# Patient Record
Sex: Female | Born: 1980 | Race: White | Hispanic: No | Marital: Single | Smoking: Former smoker
Health system: Southern US, Community
[De-identification: ages and names within clinical notes are randomized; demographics above are authoritative.]

## PROBLEM LIST (undated history)

## (undated) DIAGNOSIS — E119 Type 2 diabetes mellitus without complications: Secondary | ICD-10-CM

## (undated) DIAGNOSIS — E781 Pure hyperglyceridemia: Secondary | ICD-10-CM

## (undated) DIAGNOSIS — K859 Acute pancreatitis without necrosis or infection, unspecified: Secondary | ICD-10-CM

## (undated) HISTORY — PX: HERNIA REPAIR: SHX51

---
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
## 2004-09-21 ENCOUNTER — Ambulatory Visit: Payer: Self-pay | Admitting: Family Medicine

## 2004-09-27 ENCOUNTER — Ambulatory Visit: Payer: Self-pay | Admitting: Family Medicine

## 2004-09-27 ENCOUNTER — Other Ambulatory Visit: Admission: RE | Admit: 2004-09-27 | Discharge: 2004-09-27 | Payer: Self-pay | Admitting: Family Medicine

## 2004-10-31 ENCOUNTER — Ambulatory Visit: Payer: Self-pay | Admitting: Family Medicine

## 2004-12-14 ENCOUNTER — Ambulatory Visit: Payer: Self-pay | Admitting: Family Medicine

## 2004-12-24 ENCOUNTER — Ambulatory Visit: Payer: Self-pay | Admitting: Family Medicine

## 2005-01-03 ENCOUNTER — Ambulatory Visit: Payer: Self-pay | Admitting: Family Medicine

## 2005-01-23 ENCOUNTER — Ambulatory Visit: Payer: Self-pay | Admitting: Family Medicine

## 2005-05-02 ENCOUNTER — Ambulatory Visit: Payer: Self-pay | Admitting: Family Medicine

## 2005-05-10 ENCOUNTER — Ambulatory Visit: Payer: Self-pay | Admitting: Family Medicine

## 2005-09-24 ENCOUNTER — Ambulatory Visit: Payer: Self-pay | Admitting: Family Medicine

## 2016-07-23 DIAGNOSIS — R739 Hyperglycemia, unspecified: Secondary | ICD-10-CM | POA: Diagnosis not present

## 2016-07-23 DIAGNOSIS — R197 Diarrhea, unspecified: Secondary | ICD-10-CM | POA: Diagnosis not present

## 2016-07-24 DIAGNOSIS — R739 Hyperglycemia, unspecified: Secondary | ICD-10-CM | POA: Diagnosis not present

## 2016-07-24 DIAGNOSIS — R197 Diarrhea, unspecified: Secondary | ICD-10-CM | POA: Diagnosis not present

## 2016-10-10 DIAGNOSIS — K2980 Duodenitis without bleeding: Secondary | ICD-10-CM

## 2016-10-10 DIAGNOSIS — Z8639 Personal history of other endocrine, nutritional and metabolic disease: Secondary | ICD-10-CM | POA: Diagnosis not present

## 2016-10-10 DIAGNOSIS — E1110 Type 2 diabetes mellitus with ketoacidosis without coma: Secondary | ICD-10-CM | POA: Diagnosis not present

## 2016-10-10 DIAGNOSIS — R000 Tachycardia, unspecified: Secondary | ICD-10-CM

## 2016-10-10 DIAGNOSIS — K760 Fatty (change of) liver, not elsewhere classified: Secondary | ICD-10-CM

## 2016-10-10 DIAGNOSIS — K859 Acute pancreatitis without necrosis or infection, unspecified: Secondary | ICD-10-CM

## 2016-10-11 DIAGNOSIS — E1110 Type 2 diabetes mellitus with ketoacidosis without coma: Secondary | ICD-10-CM | POA: Diagnosis not present

## 2016-10-11 DIAGNOSIS — Z8639 Personal history of other endocrine, nutritional and metabolic disease: Secondary | ICD-10-CM | POA: Diagnosis not present

## 2016-10-11 DIAGNOSIS — K2980 Duodenitis without bleeding: Secondary | ICD-10-CM | POA: Diagnosis not present

## 2016-10-11 DIAGNOSIS — K859 Acute pancreatitis without necrosis or infection, unspecified: Secondary | ICD-10-CM | POA: Diagnosis not present

## 2016-10-12 ENCOUNTER — Encounter (HOSPITAL_COMMUNITY): Payer: Self-pay | Admitting: *Deleted

## 2016-10-12 ENCOUNTER — Inpatient Hospital Stay (HOSPITAL_COMMUNITY)
Admission: AD | Admit: 2016-10-12 | Discharge: 2016-10-24 | DRG: 438 | Disposition: A | Discharge status: Home / Self Care | Payer: BLUE CROSS/BLUE SHIELD | Source: Other Acute Inpatient Hospital | Attending: Internal Medicine | Admitting: Internal Medicine

## 2016-10-12 ENCOUNTER — Inpatient Hospital Stay (HOSPITAL_COMMUNITY): Payer: BLUE CROSS/BLUE SHIELD

## 2016-10-12 DIAGNOSIS — E8770 Fluid overload, unspecified: Secondary | ICD-10-CM | POA: Diagnosis not present

## 2016-10-12 DIAGNOSIS — E876 Hypokalemia: Secondary | ICD-10-CM | POA: Diagnosis not present

## 2016-10-12 DIAGNOSIS — E1165 Type 2 diabetes mellitus with hyperglycemia: Secondary | ICD-10-CM | POA: Diagnosis present

## 2016-10-12 DIAGNOSIS — Z6839 Body mass index (BMI) 39.0-39.9, adult: Secondary | ICD-10-CM | POA: Diagnosis not present

## 2016-10-12 DIAGNOSIS — K859 Acute pancreatitis without necrosis or infection, unspecified: Secondary | ICD-10-CM | POA: Diagnosis present

## 2016-10-12 DIAGNOSIS — R000 Tachycardia, unspecified: Secondary | ICD-10-CM | POA: Diagnosis present

## 2016-10-12 DIAGNOSIS — K760 Fatty (change of) liver, not elsewhere classified: Secondary | ICD-10-CM | POA: Diagnosis present

## 2016-10-12 DIAGNOSIS — G894 Chronic pain syndrome: Secondary | ICD-10-CM | POA: Diagnosis not present

## 2016-10-12 DIAGNOSIS — R6510 Systemic inflammatory response syndrome (SIRS) of non-infectious origin without acute organ dysfunction: Secondary | ICD-10-CM | POA: Diagnosis not present

## 2016-10-12 DIAGNOSIS — J9601 Acute respiratory failure with hypoxia: Secondary | ICD-10-CM | POA: Diagnosis not present

## 2016-10-12 DIAGNOSIS — A419 Sepsis, unspecified organism: Secondary | ICD-10-CM | POA: Diagnosis not present

## 2016-10-12 DIAGNOSIS — E781 Pure hyperglyceridemia: Secondary | ICD-10-CM | POA: Diagnosis present

## 2016-10-12 DIAGNOSIS — K567 Ileus, unspecified: Secondary | ICD-10-CM | POA: Diagnosis present

## 2016-10-12 DIAGNOSIS — E1110 Type 2 diabetes mellitus with ketoacidosis without coma: Secondary | ICD-10-CM | POA: Diagnosis present

## 2016-10-12 DIAGNOSIS — Z8639 Personal history of other endocrine, nutritional and metabolic disease: Secondary | ICD-10-CM | POA: Diagnosis not present

## 2016-10-12 DIAGNOSIS — B3749 Other urogenital candidiasis: Secondary | ICD-10-CM | POA: Diagnosis not present

## 2016-10-12 DIAGNOSIS — E119 Type 2 diabetes mellitus without complications: Secondary | ICD-10-CM | POA: Diagnosis not present

## 2016-10-12 DIAGNOSIS — J690 Pneumonitis due to inhalation of food and vomit: Secondary | ICD-10-CM | POA: Diagnosis not present

## 2016-10-12 DIAGNOSIS — Z794 Long term (current) use of insulin: Secondary | ICD-10-CM | POA: Diagnosis not present

## 2016-10-12 DIAGNOSIS — R0902 Hypoxemia: Secondary | ICD-10-CM

## 2016-10-12 DIAGNOSIS — R109 Unspecified abdominal pain: Secondary | ICD-10-CM | POA: Diagnosis present

## 2016-10-12 DIAGNOSIS — R7309 Other abnormal glucose: Secondary | ICD-10-CM | POA: Diagnosis not present

## 2016-10-12 DIAGNOSIS — T402X5A Adverse effect of other opioids, initial encounter: Secondary | ICD-10-CM | POA: Diagnosis not present

## 2016-10-12 DIAGNOSIS — T8089XA Other complications following infusion, transfusion and therapeutic injection, initial encounter: Secondary | ICD-10-CM | POA: Diagnosis not present

## 2016-10-12 DIAGNOSIS — K2980 Duodenitis without bleeding: Secondary | ICD-10-CM | POA: Diagnosis not present

## 2016-10-12 DIAGNOSIS — K858 Other acute pancreatitis without necrosis or infection: Secondary | ICD-10-CM | POA: Diagnosis not present

## 2016-10-12 DIAGNOSIS — M7989 Other specified soft tissue disorders: Secondary | ICD-10-CM | POA: Diagnosis not present

## 2016-10-12 HISTORY — DX: Pure hyperglyceridemia: E78.1

## 2016-10-12 HISTORY — DX: Acute pancreatitis without necrosis or infection, unspecified: K85.90

## 2016-10-12 HISTORY — DX: Type 2 diabetes mellitus without complications: E11.9

## 2016-10-12 LAB — CBC WITH DIFFERENTIAL/PLATELET
BASOS ABS: 0 10*3/uL (ref 0.0–0.1)
BASOS PCT: 0 %
Eosinophils Absolute: 0 10*3/uL (ref 0.0–0.7)
Eosinophils Relative: 0 %
HEMATOCRIT: 39.9 % (ref 36.0–46.0)
Hemoglobin: 13.1 g/dL (ref 12.0–15.0)
LYMPHS PCT: 16 %
Lymphs Abs: 2.1 10*3/uL (ref 0.7–4.0)
MCH: 29 pg (ref 26.0–34.0)
MCHC: 32.8 g/dL (ref 30.0–36.0)
MCV: 88.5 fL (ref 78.0–100.0)
MONO ABS: 0.4 10*3/uL (ref 0.1–1.0)
Monocytes Relative: 4 %
NEUTROS ABS: 10.2 10*3/uL — AB (ref 1.7–7.7)
Neutrophils Relative %: 80 %
Platelets: 239 10*3/uL (ref 150–400)
RBC: 4.51 MIL/uL (ref 3.87–5.11)
RDW: 14.3 % (ref 11.5–15.5)
WBC: 12.7 10*3/uL — AB (ref 4.0–10.5)

## 2016-10-12 LAB — URINALYSIS, ROUTINE W REFLEX MICROSCOPIC
Bilirubin Urine: NEGATIVE
Glucose, UA: >=500 mg/dL — AB
Ketones, ur: 5 mg/dL — AB
LEUKOCYTES UA: NEGATIVE
NITRITE: NEGATIVE
PROTEIN: 100 mg/dL — AB
SPECIFIC GRAVITY, URINE: 1.041 — AB (ref 1.005–1.030)
pH: 5 (ref 5.0–8.0)

## 2016-10-12 LAB — GLUCOSE, CAPILLARY
GLUCOSE-CAPILLARY: 160 mg/dL — AB (ref 65–99)
GLUCOSE-CAPILLARY: 197 mg/dL — AB (ref 65–99)
GLUCOSE-CAPILLARY: 206 mg/dL — AB (ref 65–99)
GLUCOSE-CAPILLARY: 226 mg/dL — AB (ref 65–99)
Glucose-Capillary: 169 mg/dL — ABNORMAL HIGH (ref 65–99)
Glucose-Capillary: 167 mg/dL — ABNORMAL HIGH (ref 65–99)
Glucose-Capillary: 187 mg/dL — ABNORMAL HIGH (ref 65–99)

## 2016-10-12 LAB — COMPREHENSIVE METABOLIC PANEL
ALBUMIN: 1.6 g/dL — AB (ref 3.5–5.0)
ALK PHOS: 31 U/L — AB (ref 38–126)
ALT: 17 U/L (ref 14–54)
AST: 26 U/L (ref 15–41)
Anion gap: 8 (ref 5–15)
BILIRUBIN TOTAL: 0.9 mg/dL (ref 0.3–1.2)
BUN: 16 mg/dL (ref 6–20)
CALCIUM: 5.6 mg/dL — AB (ref 8.9–10.3)
CO2: 14 mmol/L — ABNORMAL LOW (ref 22–32)
Chloride: 115 mmol/L — ABNORMAL HIGH (ref 101–111)
Creatinine, Ser: 1.12 mg/dL — ABNORMAL HIGH (ref 0.44–1.00)
GFR calc Af Amer: >60 mL/min (ref >60)
GLUCOSE: 191 mg/dL — AB (ref 65–99)
POTASSIUM: 3.6 mmol/L (ref 3.5–5.1)
Sodium: 137 mmol/L (ref 135–145)
TOTAL PROTEIN: 4.7 g/dL — AB (ref 6.5–8.1)

## 2016-10-12 LAB — RAPID URINE DRUG SCREEN, HOSP PERFORMED
Amphetamines: NOT DETECTED
BENZODIAZEPINES: NOT DETECTED
Barbiturates: NOT DETECTED
Cocaine: NOT DETECTED
OPIATES: POSITIVE — AB
Tetrahydrocannabinol: NOT DETECTED

## 2016-10-12 LAB — BASIC METABOLIC PANEL
Anion gap: 9 (ref 5–15)
BUN: 14 mg/dL (ref 6–20)
CALCIUM: 6.7 mg/dL — AB (ref 8.9–10.3)
CHLORIDE: 112 mmol/L — AB (ref 101–111)
CO2: 15 mmol/L — ABNORMAL LOW (ref 22–32)
CREATININE: 1.02 mg/dL — AB (ref 0.44–1.00)
GFR calc non Af Amer: >60 mL/min (ref >60)
Glucose, Bld: 238 mg/dL — ABNORMAL HIGH (ref 65–99)
Potassium: 4 mmol/L (ref 3.5–5.1)
SODIUM: 136 mmol/L (ref 135–145)

## 2016-10-12 LAB — LACTIC ACID, PLASMA
Lactic Acid, Venous: 1 mmol/L (ref 0.5–1.9)
Lactic Acid, Venous: 1 mmol/L (ref 0.5–1.9)

## 2016-10-12 LAB — APTT: APTT: 31 s (ref 24–36)

## 2016-10-12 LAB — PROTIME-INR
INR: 1.13
Prothrombin Time: 14.5 seconds (ref 11.4–15.2)

## 2016-10-12 LAB — PROCALCITONIN: Procalcitonin: 3.38 ng/mL

## 2016-10-12 LAB — MRSA PCR SCREENING: MRSA by PCR: NEGATIVE

## 2016-10-12 LAB — PHOSPHORUS: PHOSPHORUS: 1.8 mg/dL — AB (ref 2.5–4.6)

## 2016-10-12 LAB — D-DIMER, QUANTITATIVE (NOT AT ARMC): D DIMER QUANT: 5.44 ug{FEU}/mL — AB (ref 0.00–0.50)

## 2016-10-12 LAB — POCT I-STAT 3, ART BLOOD GAS (G3+)
Acid-base deficit: 10 mmol/L — ABNORMAL HIGH (ref 0.0–2.0)
Bicarbonate: 16 mmol/L — ABNORMAL LOW (ref 20.0–28.0)
O2 Saturation: 29 %
PH ART: 7.233 — AB (ref 7.350–7.450)
TCO2: 17 mmol/L (ref 0–100)
pCO2 arterial: 38.6 mmHg (ref 32.0–48.0)
pO2, Arterial: 23 mmHg — CL (ref 83.0–108.0)

## 2016-10-12 LAB — MAGNESIUM: MAGNESIUM: 2.3 mg/dL (ref 1.7–2.4)

## 2016-10-12 LAB — AMYLASE: AMYLASE: 478 U/L — AB (ref 28–100)

## 2016-10-12 LAB — CORTISOL: CORTISOL PLASMA: 37.7 ug/dL

## 2016-10-12 LAB — TROPONIN I
Troponin I: 0.04 ng/mL (ref <0.03)
Troponin I: 0.05 ng/mL (ref <0.03)

## 2016-10-12 LAB — LIPASE, BLOOD: LIPASE: 174 U/L — AB (ref 11–51)

## 2016-10-12 LAB — BETA-HYDROXYBUTYRIC ACID: Beta-Hydroxybutyric Acid: 1 mmol/L — ABNORMAL HIGH (ref 0.05–0.27)

## 2016-10-12 LAB — BRAIN NATRIURETIC PEPTIDE: B NATRIURETIC PEPTIDE 5: 190 pg/mL — AB (ref 0.0–100.0)

## 2016-10-12 MED ORDER — ORAL CARE MOUTH RINSE: 15.0000 mL | Freq: Two times a day (BID) | OROMUCOSAL | Status: DC

## 2016-10-12 MED ORDER — HYDROMORPHONE HCL 1 MG/ML IJ SOLN
0.5000 mg | INTRAMUSCULAR | Status: DC | PRN
  Administered 2016-10-12 – 2016-10-13 (×6): 0.5 mg via INTRAVENOUS
  Filled 2016-10-12 (×6): qty 1

## 2016-10-12 MED ORDER — DEXTROSE-NACL 5-0.45 % IV SOLN
INTRAVENOUS | Status: DC
  Administered 2016-10-12 – 2016-10-19 (×16): via INTRAVENOUS

## 2016-10-12 MED ORDER — INSULIN REGULAR HUMAN 100 UNIT/ML IJ SOLN: INTRAMUSCULAR | Status: DC

## 2016-10-12 MED ORDER — SODIUM CHLORIDE 0.9 % IV SOLN: INTRAVENOUS | Status: DC

## 2016-10-12 MED ORDER — SODIUM CHLORIDE 0.9 % IV SOLN
Freq: Once | INTRAVENOUS | Status: AC
  Administered 2016-10-12: 20:00:00 via INTRAVENOUS
  Filled 2016-10-12: qty 1000

## 2016-10-12 MED ORDER — HEPARIN SODIUM (PORCINE) 5000 UNIT/ML IJ SOLN: 5000.0000 [IU] | Freq: Three times a day (TID) | INTRAMUSCULAR | Status: DC

## 2016-10-12 MED ORDER — CHLORHEXIDINE GLUCONATE 0.12 % MT SOLN
15.0000 mL | Freq: Two times a day (BID) | OROMUCOSAL | Status: DC
  Administered 2016-10-12 – 2016-10-20 (×9): 15 mL via OROMUCOSAL
  Filled 2016-10-12 (×12): qty 15

## 2016-10-12 MED ORDER — ORAL CARE MOUTH RINSE
15.0000 mL | Freq: Two times a day (BID) | OROMUCOSAL | Status: DC
Start: 2016-10-13 — End: 2016-10-24
  Administered 2016-10-13: 15 mL via OROMUCOSAL

## 2016-10-12 MED ORDER — SODIUM CHLORIDE 0.9 % IV SOLN
INTRAVENOUS | Status: DC
  Administered 2016-10-12: 1 [IU]/h via INTRAVENOUS
  Administered 2016-10-14: 4.5 [IU]/h via INTRAVENOUS
  Administered 2016-10-14: 5 [IU]/h via INTRAVENOUS
  Administered 2016-10-14: 6.7 [IU]/h via INTRAVENOUS
  Administered 2016-10-15: 6.6 [IU]/h via INTRAVENOUS
  Administered 2016-10-15: 4.9 [IU]/h via INTRAVENOUS
  Administered 2016-10-15: 6.4 [IU]/h via INTRAVENOUS
  Administered 2016-10-15: 4.3 [IU]/h via INTRAVENOUS
  Administered 2016-10-15: 3.3 [IU]/h via INTRAVENOUS
  Administered 2016-10-15: 5.1 [IU]/h via INTRAVENOUS
  Administered 2016-10-16: 3.6 [IU]/h via INTRAVENOUS
  Administered 2016-10-18: 4.9 [IU]/h via INTRAVENOUS
  Filled 2016-10-12 (×5): qty 2.5

## 2016-10-12 MED ORDER — HEPARIN SODIUM (PORCINE) 5000 UNIT/ML IJ SOLN
5000.0000 [IU] | Freq: Three times a day (TID) | INTRAMUSCULAR | Status: DC
  Filled 2016-10-12: qty 1

## 2016-10-12 MED ORDER — SODIUM CHLORIDE 0.9 % IV SOLN
INTRAVENOUS | Status: AC
  Administered 2016-10-12: 18:00:00 via INTRAVENOUS

## 2016-10-12 MED ORDER — LACTATED RINGERS IV BOLUS (SEPSIS)
2000.0000 mL | Freq: Once | INTRAVENOUS | Status: AC
  Administered 2016-10-12: 2000 mL via INTRAVENOUS

## 2016-10-12 MED ORDER — HEPARIN SODIUM (PORCINE) 5000 UNIT/ML IJ SOLN
5000.0000 [IU] | Freq: Three times a day (TID) | INTRAMUSCULAR | Status: DC
  Administered 2016-10-12 – 2016-10-24 (×35): 5000 [IU] via SUBCUTANEOUS
  Filled 2016-10-12 (×32): qty 1

## 2016-10-12 MED ORDER — SODIUM CHLORIDE 0.9 % IV BOLUS (SEPSIS)
1000.0000 mL | Freq: Once | INTRAVENOUS | Status: AC
  Administered 2016-10-12: 1000 mL via INTRAVENOUS

## 2016-10-12 MED ORDER — SODIUM CHLORIDE 0.9 % IV SOLN
2.0000 g | Freq: Once | INTRAVENOUS | Status: AC
  Administered 2016-10-12: 2 g via INTRAVENOUS
  Filled 2016-10-12: qty 20

## 2016-10-12 MED ORDER — LACTATED RINGERS IV SOLN
INTRAVENOUS | Status: DC
Start: 2016-10-12 — End: 2016-10-13
  Administered 2016-10-13: 02:00:00 via INTRAVENOUS

## 2016-10-12 MED ORDER — DEXTROSE-NACL 5-0.45 % IV SOLN: INTRAVENOUS | Status: DC

## 2016-10-12 NOTE — Progress Notes (Signed)
RRT collected Gas, looks like it ended up being a VBG. RN aware

## 2016-10-12 NOTE — Progress Notes (Signed)
eLink Physician-Brief Progress Note Patient Name: Nathanial Millmanlicia D Uzelac DOB: [birth year] MRN: [id number]   Date of Service  10/12/2016  HPI/Events of Note  Calcium = 5.6 and Albumin = 1.6. Calcium corrected for albumin = 7.52.  eICU Interventions  Will replace Calcium.     Intervention Category Major Interventions: Electrolyte abnormality - evaluation and management  Sommer,Steven Eugene 10/12/2016, 8:27 PM

## 2016-10-12 NOTE — H&P (Signed)
PULMONARY / CRITICAL CARE MEDICINE   Name: Melissa Beasley MRN: [id number] DOB: Mar 30, 1981    ADMISSION DATE:  10/12/2016  HISTORY OF PRESENT ILLNESS:   Presented to [facility] on Friday w/ 1 day hx of abd pain. She has a PMHx of DM2, and apparently familial hypertriglyceridemia, although it is not known if this was known prior to her presentation. She was found to have pancreatitis with triglycerides of >5500, with a lipase of 450. RUQ showed a fatty liver, but no biliary obstruction. CT and subsequent study showed evolving pancreatic edema, but without necrosis or fluid collection. [facility] was [facility].  PAST MEDICAL HISTORY :  She  has no past medical history on file.  PAST SURGICAL HISTORY: She  has no past surgical history on file.  No Known Allergies  No current facility-administered medications on file prior to encounter.    No current outpatient prescriptions on file prior to encounter.    FAMILY HISTORY:  Her has no family status information on file.    SOCIAL HISTORY: She  reports that she has never smoked. She has never used smokeless tobacco.  REVIEW OF SYSTEMS:   She reports abdominal pain, no EtOH use  SUBJECTIVE:  36 y/o woman with pancreatitis from severe hypertriglyceridemia.   VITAL SIGNS: BP (!) 146/96   Pulse (!) 139   Temp (!) 100.9 F (38.3 C) (Core (Comment))   Resp (!) 29   Ht 5\' 6"  (1.676 m)   Wt 229 lb 0.9 oz (103.9 kg)   SpO2 96%   BMI 36.97 kg/m   HEMODYNAMICS:    VENTILATOR SETTINGS:    INTAKE / OUTPUT: I/O last 3 completed shifts: In: 3001.1 [I.V.:3001.1] Out: 200 [Emesis/NG output:200]  PHYSICAL EXAMINATION: General:  Obese woman in NAD Neuro:  Awake, somnolent, but able to answer questions HEENT:  Dry MM Cardiovascular:  Tachycardic Lungs:  CTA Abdomen:  Distended, firm, and tender Musculoskeletal:  Normal Skin:  No rashes  LABS:  BMET  Recent Labs Lab 10/12/16 1833  NA 137  K 3.6   CL 115*  CO2 14*  BUN 16  CREATININE 1.12*  GLUCOSE 191*    Electrolytes  Recent Labs Lab 10/12/16 1833  CALCIUM 5.6*  MG 2.3  PHOS 1.8*    CBC  Recent Labs Lab 10/12/16 1833  WBC 12.7*  HGB 13.1  HCT 39.9  PLT 239    Coag's  Recent Labs Lab 10/12/16 1833  APTT 31  INR 1.13    Sepsis Markers  Recent Labs Lab 10/12/16 1833  LATICACIDVEN 1.0  PROCALCITON 3.38    ABG  Recent Labs Lab 10/12/16 2042  PHART 7.233*  PCO2ART 38.6  PO2ART 23.0*    Liver Enzymes  Recent Labs Lab 10/12/16 1833  AST 26  ALT 17  ALKPHOS 31*  BILITOT 0.9  ALBUMIN 1.6*    Cardiac Enzymes  Recent Labs Lab 10/12/16 1833  TROPONINI 0.04*    Glucose  Recent Labs Lab 10/12/16 1754 10/12/16 1851 10/12/16 1957  GLUCAP 160* 169* 167*    Imaging Dg Chest Port 1 View  Result Date: 10/12/2016 CLINICAL DATA:  Acute respiratory distress EXAM: PORTABLE CHEST 1 VIEW COMPARISON:  10/12/2016 FINDINGS: Right jugular central line and nasogastric catheter are noted in satisfactory position. Cardiac shadow is stable. The overall inspiratory effort is poor with persistent left basilar atelectasis. No new focal abnormality is seen. IMPRESSION: No change from the prior exam. Electronically Signed   By: Alcide CleverMark  Lukens  M.D.   On: 10/12/2016 18:32    STUDIES:  None  CULTURES: None  ANTIBIOTICS: none  SIGNIFICANT EVENTS: None  LINES/TUBES: PIV, Foley  DISCUSSION: 36 y/o woman with pancreatitis due to very severe hyperglycemia  ASSESSMENT / PLAN:  PULMONARY A: No active issues P:    CARDIOVASCULAR A:  Tachycardia P:  Due to pancreatitis/SIRS  RENAL A:   NAGMA Mild renal insuffiencey Mild hypocalcemia P:   Change fluids to LR as hepatic function is intact and LA is normal. Replete Ca  GASTROINTESTINAL A:   Pancreatitis from hypertriglyceridemia Ileus P:   Maintain NGT for now Pain control w/ dilaudid 0.5 q2hr PRN NPO for  now  HEMATOLOGIC A:   SIRS from pancreatitis P:  At risk for clots, so good DVT ppx  INFECTIOUS A:   Fevers commonly seen with pancreatitis P:   Hold ABX for now, but low threshold to start imperic tx.  ENDOCRINE A:   Hypertriglyceridemia   Hyperglycemia P:   Ca only mildly decreased, glucose elevated, but without anion gap (i.e. Not DKA) Although Trig very elevated, given normal LA and good function, will hold off on consideration of plasma exchange for trig reduction.  NEUROLOGIC A:   Intact neuro status P:   RASS goal: 0   FAMILY  - Updates: Updated on admission  - Inter-disciplinary family meet or Palliative Care meeting due by:  10/19/16  CRITICAL CARE Performed by: Jamie Kato   Total critical care time: 60 minutes  Critical care time was exclusive of separately billable procedures and treating other patients.  Critical care was necessary to treat or prevent imminent or life-threatening deterioration.  Critical care was time spent personally by me on the following activities: development of treatment plan with patient and/or surrogate as well as nursing, discussions with consultants, evaluation of patient's response to treatment, examination of patient, obtaining history from patient or surrogate, ordering and performing treatments and interventions, ordering and review of laboratory studies, ordering and review of radiographic studies, pulse oximetry and re-evaluation of patient's condition.  Jamie Kato, MD Pulmonary and Critical Care Medicine [facility] Pager: [phone]  10/12/2016, 9:51 PM

## 2016-10-12 NOTE — Progress Notes (Signed)
eLink Physician-Brief Progress Note Patient Name: Melissa Millmanlicia D Nantz DOB: [birth year] MRN: [id number]   Date of Service  10/12/2016  HPI/Events of Note  Admitted with DKA and Pancreatitis. Currently on on Insulin IV infusion.   eICU Interventions  Will order:  1. Insulin IV infusion per DKA protocol.      Intervention Category Major Interventions: Other:  Lenell AntuSommer,Alaijah Gibler Eugene 10/12/2016, 5:19 PM

## 2016-10-13 ENCOUNTER — Inpatient Hospital Stay (HOSPITAL_COMMUNITY): Payer: BLUE CROSS/BLUE SHIELD

## 2016-10-13 DIAGNOSIS — K567 Ileus, unspecified: Secondary | ICD-10-CM

## 2016-10-13 DIAGNOSIS — E1165 Type 2 diabetes mellitus with hyperglycemia: Secondary | ICD-10-CM

## 2016-10-13 DIAGNOSIS — K858 Other acute pancreatitis without necrosis or infection: Secondary | ICD-10-CM

## 2016-10-13 LAB — GLUCOSE, CAPILLARY
GLUCOSE-CAPILLARY: 201 mg/dL — AB (ref 65–99)
GLUCOSE-CAPILLARY: 175 mg/dL — AB (ref 65–99)
GLUCOSE-CAPILLARY: 181 mg/dL — AB (ref 65–99)
GLUCOSE-CAPILLARY: 160 mg/dL — AB (ref 65–99)
Glucose-Capillary: 212 mg/dL — ABNORMAL HIGH (ref 65–99)
Glucose-Capillary: 179 mg/dL — ABNORMAL HIGH (ref 65–99)
Glucose-Capillary: 176 mg/dL — ABNORMAL HIGH (ref 65–99)

## 2016-10-13 LAB — TRIGLYCERIDES
TRIGLYCERIDES: 1694 mg/dL — AB (ref <150)
Triglycerides: 2036 mg/dL — ABNORMAL HIGH (ref <150)

## 2016-10-13 LAB — BASIC METABOLIC PANEL
ANION GAP: 4 — AB (ref 5–15)
ANION GAP: 5 (ref 5–15)
Anion gap: 6 (ref 5–15)
BUN: 12 mg/dL (ref 6–20)
BUN: 11 mg/dL (ref 6–20)
BUN: 9 mg/dL (ref 6–20)
CHLORIDE: 109 mmol/L (ref 101–111)
CHLORIDE: 110 mmol/L (ref 101–111)
CO2: 19 mmol/L — AB (ref 22–32)
CO2: 21 mmol/L — ABNORMAL LOW (ref 22–32)
CO2: 22 mmol/L (ref 22–32)
Calcium: 6.7 mg/dL — ABNORMAL LOW (ref 8.9–10.3)
Calcium: 6.8 mg/dL — ABNORMAL LOW (ref 8.9–10.3)
Calcium: 6.9 mg/dL — ABNORMAL LOW (ref 8.9–10.3)
Chloride: 112 mmol/L — ABNORMAL HIGH (ref 101–111)
Creatinine, Ser: 0.71 mg/dL (ref 0.44–1.00)
Creatinine, Ser: 0.61 mg/dL (ref 0.44–1.00)
Creatinine, Ser: 0.65 mg/dL (ref 0.44–1.00)
GFR calc non Af Amer: >60 mL/min (ref >60)
Glucose, Bld: 203 mg/dL — ABNORMAL HIGH (ref 65–99)
Glucose, Bld: 213 mg/dL — ABNORMAL HIGH (ref 65–99)
Glucose, Bld: 158 mg/dL — ABNORMAL HIGH (ref 65–99)
POTASSIUM: 4.1 mmol/L (ref 3.5–5.1)
POTASSIUM: 4.2 mmol/L (ref 3.5–5.1)
POTASSIUM: 3.6 mmol/L (ref 3.5–5.1)
SODIUM: 135 mmol/L (ref 135–145)
SODIUM: 138 mmol/L (ref 135–145)
Sodium: 135 mmol/L (ref 135–145)

## 2016-10-13 LAB — PHOSPHORUS: PHOSPHORUS: 1.2 mg/dL — AB (ref 2.5–4.6)

## 2016-10-13 LAB — CBC
HCT: 37.4 % (ref 36.0–46.0)
Hemoglobin: 12 g/dL (ref 12.0–15.0)
MCH: 28.7 pg (ref 26.0–34.0)
MCHC: 32.1 g/dL (ref 30.0–36.0)
MCV: 89.5 fL (ref 78.0–100.0)
PLATELETS: 259 10*3/uL (ref 150–400)
RBC: 4.18 MIL/uL (ref 3.87–5.11)
RDW: 14.5 % (ref 11.5–15.5)
WBC: 13.3 10*3/uL — AB (ref 4.0–10.5)

## 2016-10-13 LAB — MAGNESIUM: Magnesium: 2.3 mg/dL (ref 1.7–2.4)

## 2016-10-13 LAB — TROPONIN I: Troponin I: 0.04 ng/mL (ref <0.03)

## 2016-10-13 MED ORDER — SODIUM CHLORIDE 0.9% FLUSH
10.0000 mL | INTRAVENOUS | Status: DC | PRN
  Administered 2016-10-18 (×2): 10 mL
  Filled 2016-10-13 (×2): qty 40

## 2016-10-13 MED ORDER — ACETAMINOPHEN 325 MG PO TABS
650.0000 mg | ORAL_TABLET | Freq: Four times a day (QID) | ORAL | Status: DC | PRN
  Administered 2016-10-14 – 2016-10-19 (×2): 650 mg via ORAL
  Filled 2016-10-13 (×2): qty 2

## 2016-10-13 MED ORDER — PIPERACILLIN-TAZOBACTAM 3.375 G IVPB
3.3750 g | Freq: Three times a day (TID) | INTRAVENOUS | Status: DC
  Administered 2016-10-13 – 2016-10-24 (×32): 3.375 g via INTRAVENOUS
  Filled 2016-10-13 (×34): qty 50

## 2016-10-13 MED ORDER — POTASSIUM PHOSPHATES 15 MMOLE/5ML IV SOLN
10.0000 mmol | Freq: Once | INTRAVENOUS | Status: AC
  Administered 2016-10-13: 10 mmol via INTRAVENOUS
  Filled 2016-10-13: qty 3.33

## 2016-10-13 MED ORDER — METOPROLOL TARTRATE 5 MG/5ML IV SOLN
2.5000 mg | INTRAVENOUS | Status: DC | PRN
  Administered 2016-10-13 – 2016-10-15 (×6): 5 mg via INTRAVENOUS
  Filled 2016-10-13 (×6): qty 5

## 2016-10-13 MED ORDER — SODIUM CHLORIDE 0.9% FLUSH
10.0000 mL | INTRAVENOUS | Status: DC | PRN
  Administered 2016-10-21 – 2016-10-22 (×3): 10 mL
  Filled 2016-10-13 (×3): qty 40

## 2016-10-13 MED ORDER — SODIUM CHLORIDE 0.9% FLUSH
10.0000 mL | Freq: Two times a day (BID) | INTRAVENOUS | Status: DC
  Administered 2016-10-13 – 2016-10-19 (×8): 10 mL
  Administered 2016-10-19 – 2016-10-20 (×2): 30 mL
  Administered 2016-10-20 – 2016-10-21 (×2): 10 mL
  Administered 2016-10-21: 30 mL
  Administered 2016-10-22: 13 mL
  Administered 2016-10-22: 10 mL
  Administered 2016-10-23: 20 mL
  Administered 2016-10-23 – 2016-10-24 (×2): 10 mL

## 2016-10-13 MED ORDER — HYDROMORPHONE HCL 1 MG/ML IJ SOLN
1.0000 mg | INTRAMUSCULAR | Status: DC | PRN
  Administered 2016-10-13 – 2016-10-14 (×5): 1 mg via INTRAVENOUS
  Filled 2016-10-13 (×5): qty 1

## 2016-10-13 MED ORDER — ACETAMINOPHEN 160 MG/5ML PO SOLN
650.0000 mg | Freq: Four times a day (QID) | ORAL | Status: DC | PRN
  Administered 2016-10-13: 650 mg via ORAL
  Filled 2016-10-13: qty 20.3

## 2016-10-13 NOTE — Progress Notes (Signed)
Called for clarification on IV fluid orders. Dr. Vaughan BastaSummer wants to continue D 5 1/2 NS 125 hr in addition to the LR 1000 ml bolus x2 and then LR @ 250 ml/hr.

## 2016-10-13 NOTE — Progress Notes (Signed)
eLink Physician-Brief Progress Note Patient Name: Nathanial Millmanlicia D Joos DOB: Apr 24, 1981 MRN: [id number]   Date of Service  10/13/2016  HPI/Events of Note  Fever p tylenol with elevated procalcitonin and cultures already done  eICU Interventions  Cover GI source with Zosyn     Intervention Category Major Interventions: Infection - evaluation and management  Sandrea HughsMichael Arley Salamone 10/13/2016, 8:07 PM

## 2016-10-13 NOTE — Progress Notes (Signed)
Orthopedic Tech Progress Note Patient Details:  Nathanial Millmanlicia D Griffiths 28-Apr-1981 [id number]  Ortho Devices Type of Ortho Device: Other (comment) Ortho Device/Splint Location: lue arm elevator sling Ortho Device/Splint Interventions: Ordered, Application   Trinna PostMartinez, Lennix Rotundo J 10/13/2016, 7:44 PM

## 2016-10-13 NOTE — Progress Notes (Signed)
PULMONARY / CRITICAL CARE MEDICINE   Name: Melissa Beasley MRN: [id number] DOB: 25-May-1981    ADMISSION DATE:  10/12/2016  Brief: Presented to [facility] on Friday w/ 1 day hx of abd pain. She has a PMHx of DM2, and apparently familial hypertriglyceridemia, although it is not known if this was known prior to her presentation. She was found to have pancreatitis with triglycerides of >5500, with a lipase of 450. RUQ showed a fatty liver, but no biliary obstruction. CT and subsequent study showed evolving pancreatic edema, but without necrosis or fluid collection. [facility].  SUBJECTIVE:  Reports abdominal pain this morning. She wants to drink water. Denies nausea, vomiting. Feels abdomen distended.  VITAL SIGNS: BP (!) 154/92   Pulse (!) 125   Temp 98.1 F (36.7 C) (Core (Comment))   Resp (!) 21   Ht 5\' 6"  (1.676 m)   Wt 242 lb 15.2 oz (110.2 kg)   SpO2 97%   BMI 39.21 kg/m   HEMODYNAMICS: CVP:  [8 mmHg-10 mmHg] 10 mmHg  VENTILATOR SETTINGS:    INTAKE / OUTPUT: I/O last 3 completed shifts: In: [id number] [P.O.:60; I.V.:14053; IV Piggyback:2120] Out: 1820 [Urine:620; Emesis/NG output:1200]  PHYSICAL EXAMINATION: General:  Obese woman in NAD Neuro:  Awake, answers questions appropriately  HEENT:  Dry MM Cardiovascular:  Tachycardic in 120s-130s, no m/g/r Lungs:  CTA bilaterally Abdomen:  Distended, firm, and tender. BS hypoactive. Musculoskeletal:  Normal, no peripheral edema Skin:  No rashes  LABS:  BMET  Recent Labs Lab 10/12/16 1833 10/12/16 2244 10/13/16 0413  NA 137 136 135  K 3.6 4.0 4.1  CL 115* 112* 112*  CO2 14* 15* 19*  BUN 16 14 12   CREATININE 1.12* 1.02* 0.71  GLUCOSE 191* 238* 203*    Electrolytes  Recent Labs Lab 10/12/16 1833 10/12/16 2244 10/13/16 0413  CALCIUM 5.6* 6.7* 6.7*  MG 2.3  --  2.3  PHOS 1.8*  --  1.2*    CBC  Recent Labs Lab 10/12/16 1833 10/13/16 0413  WBC 12.7* 13.3*  HGB 13.1  12.0  HCT 39.9 37.4  PLT 239 259    Coag's  Recent Labs Lab 10/12/16 1833  APTT 31  INR 1.13    Sepsis Markers  Recent Labs Lab 10/12/16 1833 10/12/16 2244  LATICACIDVEN 1.0 1.0  PROCALCITON 3.38  --     ABG  Recent Labs Lab 10/12/16 2042  PHART 7.233*  PCO2ART 38.6  PO2ART 23.0*    Liver Enzymes  Recent Labs Lab 10/12/16 1833  AST 26  ALT 17  ALKPHOS 31*  BILITOT 0.9  ALBUMIN 1.6*    Cardiac Enzymes  Recent Labs Lab 10/12/16 1833 10/12/16 2244 10/13/16 0413  TROPONINI 0.04* 0.05* 0.04*    Glucose  Recent Labs Lab 10/13/16 0103 10/13/16 0206 10/13/16 0301 10/13/16 0404 10/13/16 0507 10/13/16 0610  GLUCAP 201* 175* 212* 181* 179* 176*    Imaging Dg Chest Port 1 View  Result Date: 10/13/2016 CLINICAL DATA:  Shortness of Breath EXAM: PORTABLE CHEST 1 VIEW COMPARISON:  10/12/2016 FINDINGS: Very low lung volumes with bilateral airspace opacities. Heart is borderline in size. No definite effusions. Right central line and NG tube are unchanged. IMPRESSION: Very low lung volumes with diffuse bilateral airspace disease, worsened since prior study. Electronically Signed   By: Charlett NoseKevin  Dover M.D.   On: 10/13/2016 07:38   Dg Chest Port 1 View  Result Date: 10/12/2016 CLINICAL DATA:  Acute respiratory distress EXAM: PORTABLE  CHEST 1 VIEW COMPARISON:  10/12/2016 FINDINGS: Right jugular central line and nasogastric catheter are noted in satisfactory position. Cardiac shadow is stable. The overall inspiratory effort is poor with persistent left basilar atelectasis. No new focal abnormality is seen. IMPRESSION: No change from the prior exam. Electronically Signed   By: Alcide Clever M.D.   On: 10/12/2016 18:32    STUDIES:  None  CULTURES: Blood 1/27>> Urine cx 1/27>> Resp cx 1/27>>  ANTIBIOTICS: none  SIGNIFICANT EVENTS: 1/27>> Transferred from [place] with pancreatitis due to hypertriglyceridemia   LINES/TUBES: PIV, Foley  DISCUSSION: 36  y/o woman with pancreatitis due to very severe hyperglycemia  ASSESSMENT / PLAN:  PULMONARY A: No active issues P:    CARDIOVASCULAR A:  Tachycardia P:  Due to pancreatitis/SIRS  RENAL A:   NAGMA Mild renal insuffiencey- improved  Mild hypocalcemia Hypophosphatemia  P:   Replete Ca, phos bmet q8h Follow UOP  GASTROINTESTINAL A:   Pancreatitis from hypertriglyceridemia Ileus P:   Maintain NGT for now Pain control w/ dilaudid 0.5 q2hr PRN NPO for now KUB this AM Insulin gtt for hypertriglyceridemia Trend triglycerides q12h until < 500  HEMATOLOGIC A:   SIRS from pancreatitis P:  Heparin SQ  INFECTIOUS A:   Fevers commonly seen with pancreatitis P:   Hold ABX for now, but low threshold to start imperic tx.  ENDOCRINE A:   Hypertriglyceridemia   Hyperglycemia P:   Trend triglyceride levels until < 500 Continue insulin gtt until triglyceride level achieved   NEUROLOGIC A:   Intact neuro status P:   RASS goal: 0   FAMILY  - Updates: Family updated at bedside 1/28  - Inter-disciplinary family meet or Palliative Care meeting due by:  10/19/16  Rich Number, MD, MPH Internal Medicine Resident, PGY-III Pager: [phone] 10/13/2016, 8:03 AM

## 2016-10-13 NOTE — Progress Notes (Signed)
eLink Physician-Brief Progress Note Patient Name: Melissa Beasley DOB: 10-14-1980 MRN: [id number]   Date of Service  10/13/2016  HPI/Events of Note  Dilaudid 0.5 mg not adequate for back pain related to pancreatitis  eICU Interventions  Increase dilaudid to 1 mg q 2h prn     Intervention Category Minor Interventions: Routine modifications to care plan (e.g. PRN medications for pain, fever)  Sandrea HughsMichael Aren Cherne 10/13/2016, 8:37 PM

## 2016-10-13 NOTE — Consult Note (Signed)
Reason for Consult:Left hand swelling     Referring Physician: Dr.Trimble  Melissa Beasley is an 36 y.o. female.  SWN:[id number] to [place] on Friday w/ 1 day hx of abd pain. She has a PMHx of DM2, and apparently familial hypertriglyceridemia, although it is not known if this was known prior to her presentation. She was found to have pancreatitis with triglycerides of >5500, with a lipase of 450. RUQ showed a fatty liver, but no biliary obstruction. CT and subsequent study showed evolving pancreatic edema, but without necrosis or fluid collection. She was transferred to [facility] for additional therapy.  No past medical history on file.  No past surgical history on file.  No family history on file.  Social History:  reports that she has never smoked. She has never used smokeless tobacco. Her alcohol and drug histories are not on file.  Allergies: No Known Allergies  Medications: I have reviewed the patient's current medications.  Results for orders placed or performed during the hospital encounter of 10/12/16 (from the past 48 hour(s))  MRSA PCR Screening     Status: None   Collection Time: 10/12/16  5:22 PM  Result Value Ref Range   MRSA by PCR NEGATIVE NEGATIVE    Comment:        The GeneXpert MRSA Assay (FDA approved for NASAL specimens only), is one component of a comprehensive MRSA colonization surveillance program. It is not intended to diagnose MRSA infection nor to guide or monitor treatment for MRSA infections.   Urinalysis, Routine w reflex microscopic     Status: Abnormal   Collection Time: 10/12/16  5:45 PM  Result Value Ref Range   Color, Urine AMBER (A) YELLOW    Comment: BIOCHEMICALS MAY BE AFFECTED BY COLOR   APPearance TURBID (A) CLEAR   Specific Gravity, Urine 1.041 (H) 1.005 - 1.030   pH 5.0 5.0 - 8.0   Glucose, UA >=500 (A) NEGATIVE mg/dL   Hgb urine dipstick SMALL (A) NEGATIVE   Bilirubin Urine NEGATIVE NEGATIVE   Ketones, ur 5 (A) NEGATIVE mg/dL   Protein, ur 100 (A) NEGATIVE mg/dL   Nitrite NEGATIVE NEGATIVE   Leukocytes, UA NEGATIVE NEGATIVE   RBC / HPF 6-30 0 - 5 RBC/hpf   WBC, UA 6-30 0 - 5 WBC/hpf   Bacteria, UA MANY (A) NONE SEEN   Squamous Epithelial / LPF 0-5 (A) NONE SEEN   Mucous PRESENT    Uric Acid Crys, UA PRESENT   Urine rapid drug screen (hosp performed)not at [facility]     Status: Abnormal   Collection Time: 10/12/16  5:45 PM  Result Value Ref Range   Opiates POSITIVE (A) NONE DETECTED   Cocaine NONE DETECTED NONE DETECTED   Benzodiazepines NONE DETECTED NONE DETECTED   Amphetamines NONE DETECTED NONE DETECTED   Tetrahydrocannabinol NONE DETECTED NONE DETECTED   Barbiturates NONE DETECTED NONE DETECTED    Comment:        DRUG SCREEN FOR MEDICAL PURPOSES ONLY.  IF CONFIRMATION IS NEEDED FOR ANY PURPOSE, NOTIFY LAB WITHIN 5 DAYS.        LOWEST DETECTABLE LIMITS FOR URINE DRUG SCREEN Drug Class       Cutoff (ng/mL) Amphetamine      1000 Barbiturate      200 Benzodiazepine   938 Tricyclics       182 Opiates          300 Cocaine          300 THC  50   Glucose, capillary     Status: Abnormal   Collection Time: 10/12/16  5:54 PM  Result Value Ref Range   Glucose-Capillary 160 (H) 65 - 99 mg/dL  Comprehensive metabolic panel     Status: Abnormal   Collection Time: 10/12/16  6:33 PM  Result Value Ref Range   Sodium 137 135 - 145 mmol/L   Potassium 3.6 3.5 - 5.1 mmol/L   Chloride 115 (H) 101 - 111 mmol/L   CO2 14 (L) 22 - 32 mmol/L   Glucose, Bld 191 (H) 65 - 99 mg/dL   BUN 16 6 - 20 mg/dL   Creatinine, Ser 1.12 (H) 0.44 - 1.00 mg/dL   Calcium 5.6 (LL) 8.9 - 10.3 mg/dL    Comment: CRITICAL RESULT CALLED TO, READ BACK BY AND VERIFIED WITH: S.STOWE,RN 10/12/16 _0  BY V.WILKINS    Total Protein 4.7 (L) 6.5 - 8.1 g/dL   Albumin 1.6 (L) 3.5 - 5.0 g/dL   AST 26 15 - 41 U/L   ALT 17 14 - 54 U/L   Alkaline Phosphatase 31 (L) 38 - 126 U/L   Total Bilirubin 0.9 0.3 - 1.2 mg/dL   GFR calc non Af  Amer >60 >60 mL/min   GFR calc Af Amer >60 >60 mL/min    Comment: (NOTE) The eGFR has been calculated using the CKD EPI equation. This calculation has not been validated in all clinical situations. eGFR's persistently <60 mL/min signify possible Chronic Kidney Disease.    Anion gap 8 5 - 15  Magnesium     Status: None   Collection Time: 10/12/16  6:33 PM  Result Value Ref Range   Magnesium 2.3 1.7 - 2.4 mg/dL  Phosphorus     Status: Abnormal   Collection Time: 10/12/16  6:33 PM  Result Value Ref Range   Phosphorus 1.8 (L) 2.5 - 4.6 mg/dL  Amylase     Status: Abnormal   Collection Time: 10/12/16  6:33 PM  Result Value Ref Range   Amylase 478 (H) 28 - 100 U/L  Lipase, blood     Status: Abnormal   Collection Time: 10/12/16  6:33 PM  Result Value Ref Range   Lipase 174 (H) 11 - 51 U/L  Troponin I     Status: Abnormal   Collection Time: 10/12/16  6:33 PM  Result Value Ref Range   Troponin I 0.04 (HH) <0.03 ng/mL    Comment: CRITICAL RESULT CALLED TO, READ BACK BY AND VERIFIED WITH: H.STOWE,RN 10/12/16 _1  BY V.WILKINS   Lactic acid, plasma     Status: None   Collection Time: 10/12/16  6:33 PM  Result Value Ref Range   Lactic Acid, Venous 1.0 0.5 - 1.9 mmol/L  Procalcitonin     Status: None   Collection Time: 10/12/16  6:33 PM  Result Value Ref Range   Procalcitonin 3.38 ng/mL    Comment:        Interpretation: PCT > 2 ng/mL: Systemic infection (sepsis) is likely, unless other causes are known. (NOTE)         ICU PCT Algorithm               Non ICU PCT Algorithm    ----------------------------     ------------------------------         PCT < 0.25 ng/mL                 PCT < 0.1 ng/mL     Stopping of antibiotics  Stopping of antibiotics       strongly encouraged.               strongly encouraged.    ----------------------------     ------------------------------       PCT level decrease by               PCT < 0.25 ng/mL       >= 80% from peak PCT       OR  PCT 0.25 - 0.5 ng/mL          Stopping of antibiotics                                             encouraged.     Stopping of antibiotics           encouraged.    ----------------------------     ------------------------------       PCT level decrease by              PCT >= 0.25 ng/mL       < 80% from peak PCT        AND PCT >= 0.5 ng/mL            Continuing antibiotics                                               encouraged.       Continuing antibiotics            encouraged.    ----------------------------     ------------------------------     PCT level increase compared          PCT > 0.5 ng/mL         with peak PCT AND          PCT >= 0.5 ng/mL             Escalation of antibiotics                                          strongly encouraged.      Escalation of antibiotics        strongly encouraged.   Brain natriuretic peptide     Status: Abnormal   Collection Time: 10/12/16  6:33 PM  Result Value Ref Range   B Natriuretic Peptide 190.0 (H) 0.0 - 100.0 pg/mL  Cortisol     Status: None   Collection Time: 10/12/16  6:33 PM  Result Value Ref Range   Cortisol, Plasma 37.7 ug/dL    Comment: (NOTE) AM    6.7 - 22.6 ug/dL PM   <10.0       ug/dL   CBC WITH DIFFERENTIAL     Status: Abnormal   Collection Time: 10/12/16  6:33 PM  Result Value Ref Range   WBC 12.7 (H) 4.0 - 10.5 K/uL   RBC 4.51 3.87 - 5.11 MIL/uL   Hemoglobin 13.1 12.0 - 15.0 g/dL   HCT 39.9 36.0 - 46.0 %   MCV 88.5 78.0 - 100.0 fL   MCH 29.0 26.0 - 34.0 pg   MCHC 32.8 30.0 - 36.0 g/dL  RDW 14.3 11.5 - 15.5 %   Platelets 239 150 - 400 K/uL   Neutrophils Relative % 80 %   Neutro Abs 10.2 (H) 1.7 - 7.7 K/uL   Lymphocytes Relative 16 %   Lymphs Abs 2.1 0.7 - 4.0 K/uL   Monocytes Relative 4 %   Monocytes Absolute 0.4 0.1 - 1.0 K/uL   Eosinophils Relative 0 %   Eosinophils Absolute 0.0 0.0 - 0.7 K/uL   Basophils Relative 0 %   Basophils Absolute 0.0 0.0 - 0.1 K/uL  Protime-INR     Status: None    Collection Time: 10/12/16  6:33 PM  Result Value Ref Range   Prothrombin Time 14.5 11.4 - 15.2 seconds   INR 1.13   APTT     Status: None   Collection Time: 10/12/16  6:33 PM  Result Value Ref Range   aPTT 31 24 - 36 seconds  D-dimer, quantitative (not at [facility])     Status: Abnormal   Collection Time: 10/12/16  6:33 PM  Result Value Ref Range   D-Dimer, Quant 5.44 (H) 0.00 - 0.50 ug/mL-FEU    Comment: (NOTE) At the manufacturer cut-off of 0.50 ug/mL FEU, this assay has been documented to exclude PE with a sensitivity and negative predictive value of 97 to 99%.  At this time, this assay has not been approved by the FDA to exclude DVT/VTE. Results should be correlated with clinical presentation.   Culture, blood (routine x 2)     Status: None (Preliminary result)   Collection Time: 10/12/16  6:35 PM  Result Value Ref Range   Specimen Description BLOOD RIGHT ANTECUBITAL    Special Requests BOTTLES DRAWN AEROBIC AND ANAEROBIC 5CC    Culture NO GROWTH < 24 HOURS    Report Status PENDING   Culture, blood (routine x 2)     Status: None (Preliminary result)   Collection Time: 10/12/16  6:43 PM  Result Value Ref Range   Specimen Description BLOOD LEFT ANTECUBITAL    Special Requests BOTTLES DRAWN AEROBIC AND ANAEROBIC 5CC    Culture NO GROWTH < 24 HOURS    Report Status PENDING   Beta-hydroxybutyric acid     Status: Abnormal   Collection Time: 10/12/16  6:44 PM  Result Value Ref Range   Beta-Hydroxybutyric Acid 1.00 (H) 0.05 - 0.27 mmol/L  Glucose, capillary     Status: Abnormal   Collection Time: 10/12/16  6:51 PM  Result Value Ref Range   Glucose-Capillary 169 (H) 65 - 99 mg/dL  Glucose, capillary     Status: Abnormal   Collection Time: 10/12/16  7:57 PM  Result Value Ref Range   Glucose-Capillary 167 (H) 65 - 99 mg/dL  I-STAT 3, arterial blood gas (G3+)     Status: Abnormal   Collection Time: 10/12/16  8:42 PM  Result Value Ref Range   pH, Arterial 7.233 (L) 7.350 - 7.450    pCO2 arterial 38.6 32.0 - 48.0 mmHg   pO2, Arterial 23.0 (LL) 83.0 - 108.0 mmHg   Bicarbonate 16.0 (L) 20.0 - 28.0 mmol/L   TCO2 17 0 - 100 mmol/L   O2 Saturation 29.0 %   Acid-base deficit 10.0 (H) 0.0 - 2.0 mmol/L   Patient temperature 38.2 C    Sample type ARTERIAL    Comment NOTIFIED PHYSICIAN   Glucose, capillary     Status: Abnormal   Collection Time: 10/12/16  8:58 PM  Result Value Ref Range   Glucose-Capillary 187 (H) 65 - 99  mg/dL  Glucose, capillary     Status: Abnormal   Collection Time: 10/12/16  9:59 PM  Result Value Ref Range   Glucose-Capillary 197 (H) 65 - 99 mg/dL  Basic metabolic panel     Status: Abnormal   Collection Time: 10/12/16 10:44 PM  Result Value Ref Range   Sodium 136 135 - 145 mmol/L   Potassium 4.0 3.5 - 5.1 mmol/L   Chloride 112 (H) 101 - 111 mmol/L   CO2 15 (L) 22 - 32 mmol/L   Glucose, Bld 238 (H) 65 - 99 mg/dL   BUN 14 6 - 20 mg/dL   Creatinine, Ser 1.02 (H) 0.44 - 1.00 mg/dL   Calcium 6.7 (L) 8.9 - 10.3 mg/dL   GFR calc non Af Amer >60 >60 mL/min   GFR calc Af Amer >60 >60 mL/min    Comment: (NOTE) The eGFR has been calculated using the CKD EPI equation. This calculation has not been validated in all clinical situations. eGFR's persistently <60 mL/min signify possible Chronic Kidney Disease.    Anion gap 9 5 - 15  Troponin I     Status: Abnormal   Collection Time: 10/12/16 10:44 PM  Result Value Ref Range   Troponin I 0.05 (HH) <0.03 ng/mL    Comment: CRITICAL VALUE NOTED.  VALUE IS CONSISTENT WITH PREVIOUSLY REPORTED AND CALLED VALUE.  Lactic acid, plasma     Status: None   Collection Time: 10/12/16 10:44 PM  Result Value Ref Range   Lactic Acid, Venous 1.0 0.5 - 1.9 mmol/L  Glucose, capillary     Status: Abnormal   Collection Time: 10/12/16 10:53 PM  Result Value Ref Range   Glucose-Capillary 226 (H) 65 - 99 mg/dL  Glucose, capillary     Status: Abnormal   Collection Time: 10/12/16 11:59 PM  Result Value Ref Range    Glucose-Capillary 206 (H) 65 - 99 mg/dL  Glucose, capillary     Status: Abnormal   Collection Time: 10/13/16  1:03 AM  Result Value Ref Range   Glucose-Capillary 201 (H) 65 - 99 mg/dL  Glucose, capillary     Status: Abnormal   Collection Time: 10/13/16  2:06 AM  Result Value Ref Range   Glucose-Capillary 175 (H) 65 - 99 mg/dL  Glucose, capillary     Status: Abnormal   Collection Time: 10/13/16  3:01 AM  Result Value Ref Range   Glucose-Capillary 212 (H) 65 - 99 mg/dL  Glucose, capillary     Status: Abnormal   Collection Time: 10/13/16  4:04 AM  Result Value Ref Range   Glucose-Capillary 181 (H) 65 - 99 mg/dL  Basic metabolic panel     Status: Abnormal   Collection Time: 10/13/16  4:13 AM  Result Value Ref Range   Sodium 135 135 - 145 mmol/L   Potassium 4.1 3.5 - 5.1 mmol/L   Chloride 112 (H) 101 - 111 mmol/L   CO2 19 (L) 22 - 32 mmol/L   Glucose, Bld 203 (H) 65 - 99 mg/dL   BUN 12 6 - 20 mg/dL   Creatinine, Ser 0.71 0.44 - 1.00 mg/dL   Calcium 6.7 (L) 8.9 - 10.3 mg/dL   GFR calc non Af Amer >60 >60 mL/min   GFR calc Af Amer >60 >60 mL/min    Comment: (NOTE) The eGFR has been calculated using the CKD EPI equation. This calculation has not been validated in all clinical situations. eGFR's persistently <60 mL/min signify possible Chronic Kidney Disease.    Anion  gap 4 (L) 5 - 15  Troponin I     Status: Abnormal   Collection Time: 10/13/16  4:13 AM  Result Value Ref Range   Troponin I 0.04 (HH) <0.03 ng/mL    Comment: CRITICAL VALUE NOTED.  VALUE IS CONSISTENT WITH PREVIOUSLY REPORTED AND CALLED VALUE.  CBC     Status: Abnormal   Collection Time: 10/13/16  4:13 AM  Result Value Ref Range   WBC 13.3 (H) 4.0 - 10.5 K/uL   RBC 4.18 3.87 - 5.11 MIL/uL   Hemoglobin 12.0 12.0 - 15.0 g/dL   HCT 37.4 36.0 - 46.0 %   MCV 89.5 78.0 - 100.0 fL   MCH 28.7 26.0 - 34.0 pg   MCHC 32.1 30.0 - 36.0 g/dL   RDW 14.5 11.5 - 15.5 %   Platelets 259 150 - 400 K/uL  Magnesium     Status:  None   Collection Time: 10/13/16  4:13 AM  Result Value Ref Range   Magnesium 2.3 1.7 - 2.4 mg/dL  Phosphorus     Status: Abnormal   Collection Time: 10/13/16  4:13 AM  Result Value Ref Range   Phosphorus 1.2 (L) 2.5 - 4.6 mg/dL  Triglycerides     Status: Abnormal   Collection Time: 10/13/16  4:13 AM  Result Value Ref Range   Triglycerides 2,036 (H) <150 mg/dL    Comment: RESULTS CONFIRMED BY MANUAL DILUTION  Glucose, capillary     Status: Abnormal   Collection Time: 10/13/16  5:07 AM  Result Value Ref Range   Glucose-Capillary 179 (H) 65 - 99 mg/dL  Glucose, capillary     Status: Abnormal   Collection Time: 10/13/16  6:10 AM  Result Value Ref Range   Glucose-Capillary 176 (H) 65 - 99 mg/dL  Glucose, capillary     Status: Abnormal   Collection Time: 10/13/16 12:13 PM  Result Value Ref Range   Glucose-Capillary 160 (H) 65 - 99 mg/dL    Dg Chest Port 1 View  Result Date: 10/13/2016 CLINICAL DATA:  Shortness of Breath EXAM: PORTABLE CHEST 1 VIEW COMPARISON:  10/12/2016 FINDINGS: Very low lung volumes with bilateral airspace opacities. Heart is borderline in size. No definite effusions. Right central line and NG tube are unchanged. IMPRESSION: Very low lung volumes with diffuse bilateral airspace disease, worsened since prior study. Electronically Signed   By: Rolm Baptise M.D.   On: 10/13/2016 07:38   Dg Chest Port 1 View  Result Date: 10/12/2016 CLINICAL DATA:  Acute respiratory distress EXAM: PORTABLE CHEST 1 VIEW COMPARISON:  10/12/2016 FINDINGS: Right jugular central line and nasogastric catheter are noted in satisfactory position. Cardiac shadow is stable. The overall inspiratory effort is poor with persistent left basilar atelectasis. No new focal abnormality is seen. IMPRESSION: No change from the prior exam. Electronically Signed   By: Inez Catalina M.D.   On: 10/12/2016 18:32   Dg Abd Portable 1v  Result Date: 10/13/2016 CLINICAL DATA:  Patient with ileus.  NG tube  placement. EXAM: PORTABLE ABDOMEN - 1 VIEW COMPARISON:  CT abdomen pelvis 10/11/2016. FINDINGS: Enteric tube tip and side-port project over the stomach. Gas is demonstrated within mildly dilated loops of small bowel within the central and left hemiabdomen. At lumbar spine degenerative changes. IMPRESSION: Enteric tube tip and side-port project over the stomach. Suspect mild ileus. Electronically Signed   By: Lovey Newcomer M.D.   On: 10/13/2016 11:28    ROS AS DOCUMENTED IN CHART Blood pressure (!) 142/82, pulse Marland Kitchen)  145, temperature 98.7 F (37.1 C), temperature source Axillary, resp. rate (!) 29, height _0  (1.676 m), weight 242 lb 15.2 oz (110.2 kg), SpO2 95 %. Physical Exam  GENERAL: SLEEPING, AWAKENS AND FOLLOWS COMMANDS APPROPRIATELY RIGHT HAND: FINGER TIPS WARM WELL PERFUSED, FINGER TIPS WITH SIGNS OF GLUCOSE MONITORING, GOOD DIGITAL MOTION GOOD RADIAL PULSE LEFT HAND: MODERATE SWELLING DORSUM OF HAND. ECCHYMOSIS DORSALLY FINGERS WARM WELL PERFUSED, GOOD RADIAL PULSE, PULSE OX READING WELL AT 96% FINGERS WITH GOOD TURGOR AND MILDLY DELAYED CAP REFIL COMPARED WITH RIGHT <3 SECONDS COMPARTMENTS SWOLLEN, BUT SOFT ABLE TO FLEX THUMB IP JOINT AND EXTEND FINGERS, OLD IV SITE ON DORSUM OF HAND WITHOUT ERYTHEMA OR PURULENCE    Assessment/Plan: A: LEFT HAND SWELLING LIKELY FROM IV FLUIDS RUNNING THROUGH DORSUM OF HAND, NO EVIDENCE OF COMPARTMENT SYNDROME  PLAN: CARE DISCUSSED WITH NURSING STAFF, STAFF IN ROOM STRICT ELEVATION ABOVE HEART. ICE TO DORSUM OF HAND TO HELP WITH SWELLING OK TO MOVE FINGERS NO SURGERY RECOMMENDED PLEASE CONTACT ME DIRECTLY IF QUESTIONS  [phone] [facility] ORDERED  Linna Hoff 10/13/2016, 1:07 PM

## 2016-10-14 DIAGNOSIS — G894 Chronic pain syndrome: Secondary | ICD-10-CM

## 2016-10-14 DIAGNOSIS — Z794 Long term (current) use of insulin: Secondary | ICD-10-CM

## 2016-10-14 LAB — GLUCOSE, CAPILLARY
GLUCOSE-CAPILLARY: 173 mg/dL — AB (ref 65–99)
GLUCOSE-CAPILLARY: 144 mg/dL — AB (ref 65–99)
GLUCOSE-CAPILLARY: 171 mg/dL — AB (ref 65–99)
GLUCOSE-CAPILLARY: 154 mg/dL — AB (ref 65–99)
GLUCOSE-CAPILLARY: 176 mg/dL — AB (ref 65–99)
GLUCOSE-CAPILLARY: 137 mg/dL — AB (ref 65–99)
GLUCOSE-CAPILLARY: 171 mg/dL — AB (ref 65–99)
GLUCOSE-CAPILLARY: 165 mg/dL — AB (ref 65–99)
GLUCOSE-CAPILLARY: 155 mg/dL — AB (ref 65–99)
GLUCOSE-CAPILLARY: 182 mg/dL — AB (ref 65–99)
GLUCOSE-CAPILLARY: 193 mg/dL — AB (ref 65–99)
GLUCOSE-CAPILLARY: 159 mg/dL — AB (ref 65–99)
GLUCOSE-CAPILLARY: 130 mg/dL — AB (ref 65–99)
Glucose-Capillary: 157 mg/dL — ABNORMAL HIGH (ref 65–99)
Glucose-Capillary: 224 mg/dL — ABNORMAL HIGH (ref 65–99)
Glucose-Capillary: 137 mg/dL — ABNORMAL HIGH (ref 65–99)
Glucose-Capillary: 144 mg/dL — ABNORMAL HIGH (ref 65–99)
Glucose-Capillary: 149 mg/dL — ABNORMAL HIGH (ref 65–99)
Glucose-Capillary: 150 mg/dL — ABNORMAL HIGH (ref 65–99)
Glucose-Capillary: 153 mg/dL — ABNORMAL HIGH (ref 65–99)
Glucose-Capillary: 154 mg/dL — ABNORMAL HIGH (ref 65–99)
Glucose-Capillary: 158 mg/dL — ABNORMAL HIGH (ref 65–99)
Glucose-Capillary: 160 mg/dL — ABNORMAL HIGH (ref 65–99)
Glucose-Capillary: 161 mg/dL — ABNORMAL HIGH (ref 65–99)
Glucose-Capillary: 163 mg/dL — ABNORMAL HIGH (ref 65–99)
Glucose-Capillary: 163 mg/dL — ABNORMAL HIGH (ref 65–99)
Glucose-Capillary: 163 mg/dL — ABNORMAL HIGH (ref 65–99)
Glucose-Capillary: 164 mg/dL — ABNORMAL HIGH (ref 65–99)
Glucose-Capillary: 166 mg/dL — ABNORMAL HIGH (ref 65–99)
Glucose-Capillary: 169 mg/dL — ABNORMAL HIGH (ref 65–99)
Glucose-Capillary: 173 mg/dL — ABNORMAL HIGH (ref 65–99)
Glucose-Capillary: 173 mg/dL — ABNORMAL HIGH (ref 65–99)
Glucose-Capillary: 173 mg/dL — ABNORMAL HIGH (ref 65–99)
Glucose-Capillary: 178 mg/dL — ABNORMAL HIGH (ref 65–99)
Glucose-Capillary: 185 mg/dL — ABNORMAL HIGH (ref 65–99)
Glucose-Capillary: 199 mg/dL — ABNORMAL HIGH (ref 65–99)
Glucose-Capillary: 155 mg/dL — ABNORMAL HIGH (ref 65–99)

## 2016-10-14 LAB — BASIC METABOLIC PANEL WITH GFR
Anion gap: 5 (ref 5–15)
BUN: 9 mg/dL (ref 6–20)
CO2: 24 mmol/L (ref 22–32)
Calcium: 6.6 mg/dL — ABNORMAL LOW (ref 8.9–10.3)
Chloride: 109 mmol/L (ref 101–111)
Creatinine, Ser: 0.62 mg/dL (ref 0.44–1.00)
GFR calc Af Amer: >60 mL/min
GFR calc non Af Amer: >60 mL/min
Glucose, Bld: 165 mg/dL — ABNORMAL HIGH (ref 65–99)
Potassium: 3.3 mmol/L — ABNORMAL LOW (ref 3.5–5.1)
Sodium: 138 mmol/L (ref 135–145)

## 2016-10-14 LAB — URINE CULTURE

## 2016-10-14 LAB — TRIGLYCERIDES
Triglycerides: 1395 mg/dL — ABNORMAL HIGH
Triglycerides: 1196 mg/dL — ABNORMAL HIGH

## 2016-10-14 LAB — PHOSPHORUS: Phosphorus: 1.2 mg/dL — ABNORMAL LOW (ref 2.5–4.6)

## 2016-10-14 LAB — MAGNESIUM: Magnesium: 2.3 mg/dL (ref 1.7–2.4)

## 2016-10-14 MED ORDER — POTASSIUM CHLORIDE 20 MEQ PO PACK
40.0000 meq | PACK | Freq: Once | ORAL | Status: DC
  Filled 2016-10-14: qty 2

## 2016-10-14 MED ORDER — CYCLOBENZAPRINE HCL 10 MG PO TABS
10.0000 mg | ORAL_TABLET | Freq: Three times a day (TID) | ORAL | Status: DC | PRN
  Administered 2016-10-14 – 2016-10-21 (×8): 10 mg via ORAL
  Filled 2016-10-14 (×8): qty 1

## 2016-10-14 MED ORDER — HYDROMORPHONE HCL 1 MG/ML IJ SOLN
0.5000 mg | INTRAMUSCULAR | Status: DC | PRN
  Administered 2016-10-14 – 2016-10-16 (×20): 0.5 mg via INTRAVENOUS
  Filled 2016-10-14 (×21): qty 1

## 2016-10-14 MED ORDER — LIDOCAINE 5 % EX PTCH
1.0000 | MEDICATED_PATCH | CUTANEOUS | Status: DC
  Administered 2016-10-14: 1 via TRANSDERMAL
  Filled 2016-10-14 (×6): qty 1

## 2016-10-14 MED ORDER — POTASSIUM PHOSPHATES 15 MMOLE/5ML IV SOLN
30.0000 mmol | Freq: Once | INTRAVENOUS | Status: AC
  Administered 2016-10-14: 30 mmol via INTRAVENOUS
  Filled 2016-10-14: qty 10

## 2016-10-14 MED ORDER — SODIUM CHLORIDE 0.9 % IV SOLN
30.0000 meq | Freq: Two times a day (BID) | INTRAVENOUS | Status: DC
  Administered 2016-10-14: 30 meq via INTRAVENOUS
  Filled 2016-10-14: qty 15

## 2016-10-14 MED ORDER — SODIUM CHLORIDE 0.9 % IV SOLN: 30.0000 meq | Freq: Two times a day (BID) | INTRAVENOUS | Status: DC

## 2016-10-14 NOTE — Progress Notes (Signed)
PULMONARY / CRITICAL CARE MEDICINE   Name: Nathanial Millmanlicia D Grine MRN: [id number] DOB: [birth year]    ADMISSION DATE:  10/12/2016  Brief: Presented to [facility] on Friday w/ 1 day hx of abd pain. She has a PMHx of DM2, and apparently familial hypertriglyceridemia, although it is not known if this was known prior to her presentation. She was found to have pancreatitis with triglycerides of >5500, with a lipase of 450. RUQ showed a fatty liver, but no biliary obstruction. CT and subsequent study showed evolving pancreatic edema, but without necrosis or fluid collection. [facility] was [facility] for [facility].  SUBJECTIVE:  No events overnight, standing up talking on the phone, no new complaints  VITAL SIGNS: BP 134/88   Pulse (!) 125   Temp 98.9 F (37.2 C) (Tympanic)   Resp 18   Ht 5\' 6"  (1.676 m)   Wt 110.2 kg (242 lb 15.2 oz)   SpO2 90%   BMI 39.21 kg/m   HEMODYNAMICS:    VENTILATOR SETTINGS:    INTAKE / OUTPUT: I/O last 3 completed shifts: In: 40981.116163.2 [P.O.:120; I.V.:13613.2; IV Piggyback:2430] Out: 4945 [Urine:2395; Emesis/NG output:2550]  PHYSICAL EXAMINATION: General:  Obese woman in NAD, standing up, talking on the phone Neuro:  Awake, alert and oriented x 3 HEENT:  [place]/AT, EOMI, mucus membranes dry Cardiovascular:  Tachycardic in 120s-130s, no m/g/r Lungs:  CTA bilaterally Abdomen:  BS+, distended, firm, and non-tender Musculoskeletal:  Normal, no peripheral edema. Left hand swelling improving, sensation intact.  Skin:  No rashes  LABS:  BMET  Recent Labs Lab 10/13/16 1330 10/13/16 2117 10/14/16 0538  NA 135 138 138  K 4.2 3.6 3.3*  CL 109 110 109  CO2 21* 22 24  BUN 11 9 9   CREATININE 0.61 0.65 0.62  GLUCOSE 213* 158* 165*   Electrolytes  Recent Labs Lab 10/12/16 1833  10/13/16 0413 10/13/16 1330 10/13/16 2117 10/14/16 0538  CALCIUM 5.6*  < > 6.7* 6.8* 6.9* 6.6*  MG 2.3  --  2.3  --   --  2.3  PHOS 1.8*  --  1.2*  --   --  1.2*  < > =  values in this interval not displayed.  CBC  Recent Labs Lab 10/12/16 1833 10/13/16 0413  WBC 12.7* 13.3*  HGB 13.1 12.0  HCT 39.9 37.4  PLT 239 259   Coag's  Recent Labs Lab 10/12/16 1833  APTT 31  INR 1.13   Sepsis Markers  Recent Labs Lab 10/12/16 1833 10/12/16 2244  LATICACIDVEN 1.0 1.0  PROCALCITON 3.38  --    ABG  Recent Labs Lab 10/12/16 2042  PHART 7.233*  PCO2ART 38.6  PO2ART 23.0*   Liver Enzymes  Recent Labs Lab 10/12/16 1833  AST 26  ALT 17  ALKPHOS 31*  BILITOT 0.9  ALBUMIN 1.6*   Cardiac Enzymes  Recent Labs Lab 10/12/16 1833 10/12/16 2244 10/13/16 0413  TROPONINI 0.04* 0.05* 0.04*   Glucose  Recent Labs Lab 10/13/16 0301 10/13/16 0404 10/13/16 0507 10/13/16 0610 10/13/16 1213 10/14/16 0826  GLUCAP 212* 181* 179* 176* 160* 157*   Imaging Dg Abd Portable 1v  Result Date: 10/13/2016 CLINICAL DATA:  Patient with ileus.  NG tube placement. EXAM: PORTABLE ABDOMEN - 1 VIEW COMPARISON:  CT abdomen pelvis 10/11/2016. FINDINGS: Enteric tube tip and side-port project over the stomach. Gas is demonstrated within mildly dilated loops of small bowel within the central and left hemiabdomen. At lumbar spine degenerative changes. IMPRESSION: Enteric tube tip and side-port  project over the stomach. Suspect mild ileus. Electronically Signed   By: Annia Belt M.D.   On: 10/13/2016 11:28   STUDIES:  None  CULTURES: Blood 1/27>> Urine cx 1/27>> Resp cx 1/27>>  ANTIBIOTICS: none  SIGNIFICANT EVENTS: 1/27>> Transferred from [place] with pancreatitis due to hypertriglyceridemia   LINES/TUBES: PIV, Foley  I reviewed CXR myself, no acute disease.  DISCUSSION: 36 y/o woman with pancreatitis due to very severe hyperglycemia  ASSESSMENT / PLAN:  PULMONARY A: Hypoxemia overnight, specially after taking dilaudid P:   Titrate O2 for sat of 88-92% Half dilaudid dose  CARDIOVASCULAR A:  Tachycardia due to pancreatitis P:   Cardiac monitoring  RENAL A:   NAGMA- resolved Mild renal insuffiencey- improved  Mild hypocalcemia Hypophosphatemia  Hypokalemia P:   K and Phos replacement Check mg, phos, replete as needed Bmet in AM IVF ordered Follow UOP  GASTROINTESTINAL A:   Pancreatitis from hypertriglyceridemia Ileus P:   Maintain NGT for now- output 1200 overnight Pain control w/ dilaudid 1mg  q2hr PRN NPO for now, can have sips of water and ice chips. If tolerates well can try clears Insulin gtt for hypertriglyceridemia Trend triglycerides q12h until < 500  HEMATOLOGIC A:   SIRS from pancreatitis P:  Heparin SQ  INFECTIOUS A:   Fevers commonly seen with pancreatitis P:   Monitor WBC count/fever curves Zosyn for empiric coverage  ENDOCRINE A:   Hypertriglyceridemia   DM/Hyperglycemia P:   Trend triglyceride levels until < 500 Continue insulin gtt until triglyceride level achieved   NEUROLOGIC A:   Intact neuro status P:   Decrease dilaudid to 0.5 mg IV q2 hours  MSK A:  Left hand swelling/pain/numbness due to infiltrated IV P: Hand surgery saw 1/28> do not think compartment syndrome, appreciate recs Keep hand elevated above heart, ice as needed  FAMILY  - Updates: Patient did not hod her phone call for any updates or questions.  - Inter-disciplinary family meet or Palliative Care meeting due by:  10/19/16  Discussed with [facility]-MD, transfer to SDU and to [facility] service with PCCM off 1/30.  Alyson Reedy, M.D. [facility] Pulmonary/Critical Care Medicine. Pager: [phone]. After hours pager: [phone].

## 2016-10-14 NOTE — Progress Notes (Signed)
PULMONARY / CRITICAL CARE MEDICINE   Name: Nathanial Millmanlicia D Guest MRN: [id number] DOB: Feb 25, 1981    ADMISSION DATE:  10/12/2016  Brief: Presented to [facility] on Friday w/ 1 day hx of abd pain. She has a PMHx of DM2, and apparently familial hypertriglyceridemia, although it is not known if this was known prior to her presentation. She was found to have pancreatitis with triglycerides of >5500, with a lipase of 450. RUQ showed a fatty liver, but no biliary obstruction. CT and subsequent study showed evolving pancreatic edema, but without necrosis or fluid collection. She was transferred to [facility] for additional therapy.  SUBJECTIVE:  Complains of back pain. Dilaudid increased last night. Zosyn added for GI source coverage. Wants to eat. Fevers up to 101.7 last night. Denies any abdominal pain. Had BM last night.  VITAL SIGNS: BP (!) 161/97   Pulse (!) 129   Temp (!) 100.8 F (38.2 C) (Core (Comment))   Resp 16   Ht 5\' 6"  (1.676 m)   Wt 242 lb 15.2 oz (110.2 kg)   SpO2 (!) 89%   BMI 39.21 kg/m   HEMODYNAMICS:    VENTILATOR SETTINGS:    INTAKE / OUTPUT: I/O last 3 completed shifts: In: 21308.616163.2 [P.O.:120; I.V.:13613.2; IV Piggyback:2430] Out: 4945 [Urine:2395; Emesis/NG output:2550]  PHYSICAL EXAMINATION: General:  Obese woman in NAD, sitting up in chair Neuro:  Awake, alert and oriented x 3 HEENT:  [place]/AT, EOMI, mucus membranes dry Cardiovascular:  Tachycardic in 120s-130s, no m/g/r Lungs:  CTA bilaterally Abdomen:  BS+, distended, firm, and non-tender Musculoskeletal:  Normal, no peripheral edema. Left hand swelling improving, sensation intact.  Skin:  No rashes  LABS:  BMET  Recent Labs Lab 10/13/16 1330 10/13/16 2117 10/14/16 0538  NA 135 138 138  K 4.2 3.6 3.3*  CL 109 110 109  CO2 21* 22 24  BUN 11 9 9   CREATININE 0.61 0.65 0.62  GLUCOSE 213* 158* 165*    Electrolytes  Recent Labs Lab 10/12/16 1833  10/13/16 0413 10/13/16 1330 10/13/16 2117 10/14/16 0538   CALCIUM 5.6*  < > 6.7* 6.8* 6.9* 6.6*  MG 2.3  --  2.3  --   --   --   PHOS 1.8*  --  1.2*  --   --   --   < > = values in this interval not displayed.  CBC  Recent Labs Lab 10/12/16 1833 10/13/16 0413  WBC 12.7* 13.3*  HGB 13.1 12.0  HCT 39.9 37.4  PLT 239 259    Coag's  Recent Labs Lab 10/12/16 1833  APTT 31  INR 1.13    Sepsis Markers  Recent Labs Lab 10/12/16 1833 10/12/16 2244  LATICACIDVEN 1.0 1.0  PROCALCITON 3.38  --     ABG  Recent Labs Lab 10/12/16 2042  PHART 7.233*  PCO2ART 38.6  PO2ART 23.0*    Liver Enzymes  Recent Labs Lab 10/12/16 1833  AST 26  ALT 17  ALKPHOS 31*  BILITOT 0.9  ALBUMIN 1.6*    Cardiac Enzymes  Recent Labs Lab 10/12/16 1833 10/12/16 2244 10/13/16 0413  TROPONINI 0.04* 0.05* 0.04*    Glucose  Recent Labs Lab 10/13/16 0206 10/13/16 0301 10/13/16 0404 10/13/16 0507 10/13/16 0610 10/13/16 1213  GLUCAP 175* 212* 181* 179* 176* 160*    Imaging Dg Abd Portable 1v  Result Date: 10/13/2016 CLINICAL DATA:  Patient with ileus.  NG tube placement. EXAM: PORTABLE ABDOMEN - 1 VIEW COMPARISON:  CT abdomen pelvis 10/11/2016. FINDINGS: Enteric tube tip  and side-port project over the stomach. Gas is demonstrated within mildly dilated loops of small bowel within the central and left hemiabdomen. At lumbar spine degenerative changes. IMPRESSION: Enteric tube tip and side-port project over the stomach. Suspect mild ileus. Electronically Signed   By: Annia Belt M.D.   On: 10/13/2016 11:28    STUDIES:  None  CULTURES: Blood 1/27>> Urine cx 1/27>> Resp cx 1/27>>  ANTIBIOTICS: none  SIGNIFICANT EVENTS: 1/27>> Transferred from [place] with pancreatitis due to hypertriglyceridemia   LINES/TUBES: PIV, Foley  DISCUSSION: 36 y/o woman with pancreatitis due to very severe hyperglycemia  ASSESSMENT / PLAN:  PULMONARY A: No active issues P:   None  CARDIOVASCULAR A:  Tachycardia due to  pancreatitis P:  Cardiac monitoring  RENAL A:   NAGMA- resolved Mild renal insuffiencey- improved  Mild hypocalcemia Hypophosphatemia  Hypokalemia P:   Replete K Check mg, phos, replete as needed Bmet in AM Follow UOP  GASTROINTESTINAL A:   Pancreatitis from hypertriglyceridemia Ileus P:   Maintain NGT for now- output 1200 overnight Pain control w/ dilaudid 1mg  q2hr PRN NPO for now, can have sips of water and ice chips. If tolerates well can try clears Insulin gtt for hypertriglyceridemia Trend triglycerides q12h until < 500  HEMATOLOGIC A:   SIRS from pancreatitis P:  Heparin SQ  INFECTIOUS A:   Fevers commonly seen with pancreatitis P:   Monitor WBC count/fever curves Zosyn for empiric coverage  ENDOCRINE A:   Hypertriglyceridemia   DM/Hyperglycemia P:   Trend triglyceride levels until < 500 Continue insulin gtt until triglyceride level achieved   NEUROLOGIC A:   Intact neuro status P:   None  MSK A:  Left hand swelling/pain/numbness due to infiltrated IV P: Hand surgery saw 1/28> do not think compartment syndrome, appreciate recs Keep hand elevated above heart, ice as needed  FAMILY  - Updates: Family updated at bedside 1/28  - Inter-disciplinary family meet or Palliative Care meeting due by:  10/19/16  Rich Number, MD, MPH Internal Medicine Resident, PGY-III Pager: [phone] 10/14/2016, 7:53 AM  Alyson Reedy, M.D. [facility] Pulmonary/Critical Care Medicine. Pager: [phone]. After hours pager: [phone].

## 2016-10-14 NOTE — Progress Notes (Signed)
eLink Physician-Brief Progress Note Patient Name: Melissa Millmanlicia D Poma DOB: 25-May-1981 MRN: [id number]   Date of Service  10/14/2016  HPI/Events of Note  C/O Low back pain. Patient takes Flexeril 10-15 mg every 6 hours at home   eICU Interventions  Resume Flexeril at low-dose      Intervention Category Minor Interventions: Other:  Adeleigh Barletta 10/14/2016, 4:14 PM

## 2016-10-14 NOTE — Care Management Note (Signed)
Case Management Note  Patient Details  Name: Nathanial Millmanlicia D Zogg MRN: [id number] Date of Birth: 11-Jul-1981  Subjective/Objective:   Adm w pancreatitis                Action/Plan: lives at home, pcp dr Thomasena Ediscollins   Expected Discharge Date:                  Expected Discharge Plan:  Home/Self Care  In-House Referral:     Discharge planning Services     Post Acute Care Choice:    Choice offered to:     DME Arranged:    DME Agency:     HH Arranged:    HH Agency:     Status of Service:  In process, will continue to follow  If discussed at Long Length of Stay Meetings, dates discussed:    Additional Comments: will moniter but no dc needs anticipated.  Hanley Haysowell, Rafael Quesada T, RN 10/14/2016, 10:59 AM

## 2016-10-14 NOTE — Progress Notes (Signed)
eLink Physician-Brief Progress Note Patient Name: Melissa Beasley DOB: 1981/07/21 MRN: [id number]   Date of Service  10/14/2016  HPI/Events of Note  Patient c/o back pain. Request for heating pad.   eICU Interventions  Will order K-Pad PRN.     Intervention Category Intermediate Interventions: Pain - evaluation and management  Meril Dray Eugene 10/14/2016, 12:48 AM

## 2016-10-15 DIAGNOSIS — K859 Acute pancreatitis without necrosis or infection, unspecified: Principal | ICD-10-CM

## 2016-10-15 DIAGNOSIS — E119 Type 2 diabetes mellitus without complications: Secondary | ICD-10-CM

## 2016-10-15 DIAGNOSIS — E781 Pure hyperglyceridemia: Secondary | ICD-10-CM | POA: Diagnosis present

## 2016-10-15 DIAGNOSIS — R7309 Other abnormal glucose: Secondary | ICD-10-CM

## 2016-10-15 LAB — GLUCOSE, CAPILLARY
GLUCOSE-CAPILLARY: 131 mg/dL — AB (ref 65–99)
GLUCOSE-CAPILLARY: 142 mg/dL — AB (ref 65–99)
GLUCOSE-CAPILLARY: 149 mg/dL — AB (ref 65–99)
GLUCOSE-CAPILLARY: 152 mg/dL — AB (ref 65–99)
GLUCOSE-CAPILLARY: 162 mg/dL — AB (ref 65–99)
GLUCOSE-CAPILLARY: 167 mg/dL — AB (ref 65–99)
GLUCOSE-CAPILLARY: 169 mg/dL — AB (ref 65–99)
GLUCOSE-CAPILLARY: 174 mg/dL — AB (ref 65–99)
GLUCOSE-CAPILLARY: 200 mg/dL — AB (ref 65–99)
Glucose-Capillary: 125 mg/dL — ABNORMAL HIGH (ref 65–99)
Glucose-Capillary: 135 mg/dL — ABNORMAL HIGH (ref 65–99)
Glucose-Capillary: 138 mg/dL — ABNORMAL HIGH (ref 65–99)
Glucose-Capillary: 150 mg/dL — ABNORMAL HIGH (ref 65–99)
Glucose-Capillary: 151 mg/dL — ABNORMAL HIGH (ref 65–99)
Glucose-Capillary: 152 mg/dL — ABNORMAL HIGH (ref 65–99)
Glucose-Capillary: 153 mg/dL — ABNORMAL HIGH (ref 65–99)
Glucose-Capillary: 159 mg/dL — ABNORMAL HIGH (ref 65–99)
Glucose-Capillary: 163 mg/dL — ABNORMAL HIGH (ref 65–99)
Glucose-Capillary: 168 mg/dL — ABNORMAL HIGH (ref 65–99)
Glucose-Capillary: 170 mg/dL — ABNORMAL HIGH (ref 65–99)
Glucose-Capillary: 178 mg/dL — ABNORMAL HIGH (ref 65–99)
Glucose-Capillary: 178 mg/dL — ABNORMAL HIGH (ref 65–99)
Glucose-Capillary: 180 mg/dL — ABNORMAL HIGH (ref 65–99)

## 2016-10-15 LAB — BASIC METABOLIC PANEL
Anion gap: 6 (ref 5–15)
BUN: 5 mg/dL — AB (ref 6–20)
CALCIUM: 7.2 mg/dL — AB (ref 8.9–10.3)
CHLORIDE: 103 mmol/L (ref 101–111)
CO2: 28 mmol/L (ref 22–32)
CREATININE: 0.59 mg/dL (ref 0.44–1.00)
GFR calc non Af Amer: >60 mL/min (ref >60)
Glucose, Bld: 144 mg/dL — ABNORMAL HIGH (ref 65–99)
Potassium: 3.2 mmol/L — ABNORMAL LOW (ref 3.5–5.1)
Sodium: 137 mmol/L (ref 135–145)

## 2016-10-15 LAB — TRIGLYCERIDES: Triglycerides: 1297 mg/dL — ABNORMAL HIGH (ref <150)

## 2016-10-15 LAB — PHOSPHORUS: Phosphorus: 2.1 mg/dL — ABNORMAL LOW (ref 2.5–4.6)

## 2016-10-15 MED ORDER — SODIUM CHLORIDE 0.9 % IV SOLN
30.0000 meq | Freq: Once | INTRAVENOUS | Status: AC
  Administered 2016-10-15: 30 meq via INTRAVENOUS
  Filled 2016-10-15: qty 15

## 2016-10-15 NOTE — Progress Notes (Addendum)
[facility]  PROGRESS NOTE  Melissa Beasley:[id number] DOB: 03/04/1981 DOA: 10/12/2016 PCP: Irena Reichmann, DO   Brief HPI:    36 y/o female presented  to [place] on Friday w/ 1 day hx of abd pain. She has a PMHx of DM2, and apparently familial hypertriglyceridemia, although it is not known if this was known prior to her presentation. She was found to have pancreatitis with triglycerides of >5500, with a lipase of 450. RUQ showed a fatty liver, but no biliary obstruction. CT and subsequent study showed evolving pancreatic edema, but without necrosis or fluid collection. She was transferred to [facility] for additional therapy.She was initially in ICU, transferred to medical floor and [facility] assumed care on 10/15/16   Subjective   Patient denies chest pain or shortness of breath. Continues to have sinus tachycardia with heart rate staying in 120s.   Assessment/Plan:     1. Acute pancreatitis- secondary to familial hypertriglyceridemia, patient presented with triglycerides greater than 5500 and lipase of 450. Lipase is now down to 174. Triglycerides are slowly trending down, today 1297. We will recheck lipase and triglyceride level in a.m.Patient had fever so was empirically started on Zosyn. Continue with Zosyn at this time. 2. Familial hypertriglyceridemia- patient presented with significantly elevated triglycerides greater than 5500, started on IV insulin to acutely reduce  Triglycerides level. It has come down to 1297. We will keep him trending triglyceride level every 12 hours until less than 500. 3. Sinus tachycardia- likely from acute pancreatitis, obtain EKG. 4. Diabetes mellitus- Glucotrol on hold, currently on IV insulin.check CBG q 1 hr. 5. Left hand swelling- patient had left and swelling/pain and numbness due to IV infiltration, and surgery saw on 10/13/2016 and did not think it was a compartment syndrome. Keep hand elevated above heart, ice as needed. 6. Hypokalemia- replace  potassium with 10 mg KCl 3, check BMP in a.m.     DVT prophylaxis: Heparin  Code Status: Full code  Family Communication: No family present at bedside   Disposition Plan: Pending improvement in triglycerides   Consultants:  [facility] M  Procedures:  None  Continuous infusions . dextrose 5 % and 0.45% NaCl 100 mL/hr at 10/15/16 1401  . insulin (NOVOLIN-R) infusion 3.1 Units/hr (10/15/16 1503)      Antibiotics:   Anti-infectives    Start     Dose/Rate Route Frequency Ordered Stop   10/13/16 2100  piperacillin-tazobactam (ZOSYN) IVPB 3.375 g     3.375 g 12.5 mL/hr over 240 Minutes Intravenous Every 8 hours 10/13/16 2008         Objective   Vitals:   10/15/16 0812 10/15/16 1120 10/15/16 1130 10/15/16 1210  BP:  (!) 159/101    Pulse:      Resp:      Temp:    99.9 F (37.7 C)  TempSrc:    Axillary  SpO2: 99%  99%   Weight:      Height:        Intake/Output Summary (Last 24 hours) at 10/15/16 1545 Last data filed at 10/15/16 0842  Gross per 24 hour  Intake          2122.54 ml  Output              400 ml  Net          1722.54 ml   Filed Weights   10/12/16 1700 10/13/16 0500 10/15/16 0400  Weight: 103.9 kg (229 lb 0.9 oz) 110.2 kg (242 lb 15.2  oz) 112.1 kg (247 lb 2.2 oz)     Physical Examination:  General exam: Appears calm and comfortable. Respiratory system: Clear to auscultation. Respiratory effort normal. Cardiovascular system:  RRR. No  murmurs, rubs, gallops. No pedal edema. GI system: Abdomen is nondistended, soft and nontender. No organomegaly.  Central nervous system. No focal neurological deficits. 5 x 5 power in all extremities. Skin: No rashes, lesions or ulcers. Psychiatry: Alert, oriented x 3.Judgement and insight appear normal. Affect normal.    Data Reviewed: I have personally reviewed following labs and imaging studies  CBG:  Recent Labs Lab 10/15/16 1135 10/15/16 1229 10/15/16 1347 10/15/16 1502 10/15/16 1537  GLUCAP 125*  138* 159* 180* 170*    CBC:  Recent Labs Lab 10/12/16 1833 10/13/16 0413  WBC 12.7* 13.3*  NEUTROABS 10.2*  --   HGB 13.1 12.0  HCT 39.9 37.4  MCV 88.5 89.5  PLT 239 259    Basic Metabolic Panel:  Recent Labs Lab 10/12/16 1833  10/13/16 0413 10/13/16 1330 10/13/16 2117 10/14/16 0538 10/15/16 0541  NA 137  < > 135 135 138 138 137  K 3.6  < > 4.1 4.2 3.6 3.3* 3.2*  CL 115*  < > 112* 109 110 109 103  CO2 14*  < > 19* 21* 22 24 28   GLUCOSE 191*  < > 203* 213* 158* 165* 144*  BUN 16  < > 12 11 9 9  5*  CREATININE 1.12*  < > 0.71 0.61 0.65 0.62 0.59  CALCIUM 5.6*  < > 6.7* 6.8* 6.9* 6.6* 7.2*  MG 2.3  --  2.3  --   --  2.3  --   PHOS 1.8*  --  1.2*  --   --  1.2* 2.1*  < > = values in this interval not displayed.  Recent Results (from the past 240 hour(Beasley))  MRSA PCR Screening     Status: None   Collection Time: 10/12/16  5:22 PM  Result Value Ref Range Status   MRSA by PCR NEGATIVE NEGATIVE Final    Comment:        The GeneXpert MRSA Assay (FDA approved for NASAL specimens only), is one component of a comprehensive MRSA colonization surveillance program. It is not intended to diagnose MRSA infection nor to guide or monitor treatment for MRSA infections.   Urine culture     Status: Abnormal   Collection Time: 10/12/16  5:45 PM  Result Value Ref Range Status   Specimen Description URINE, RANDOM  Final   Special Requests NONE  Final   Culture <10,000 COLONIES/mL INSIGNIFICANT GROWTH (A)  Final   Report Status 10/14/2016 FINAL  Final  Culture, blood (routine x 2)     Status: None (Preliminary result)   Collection Time: 10/12/16  6:35 PM  Result Value Ref Range Status   Specimen Description BLOOD RIGHT ANTECUBITAL  Final   Special Requests BOTTLES DRAWN AEROBIC AND ANAEROBIC 5CC  Final   Culture NO GROWTH 3 DAYS  Final   Report Status PENDING  Incomplete  Culture, blood (routine x 2)     Status: None (Preliminary result)   Collection Time: 10/12/16  6:43 PM   Result Value Ref Range Status   Specimen Description BLOOD LEFT ANTECUBITAL  Final   Special Requests BOTTLES DRAWN AEROBIC AND ANAEROBIC 5CC  Final   Culture NO GROWTH 3 DAYS  Final   Report Status PENDING  Incomplete     Liver Function Tests:  Recent Labs Lab 10/12/16 1833  AST 26  ALT 17  ALKPHOS 31*  BILITOT 0.9  PROT 4.7*  ALBUMIN 1.6*    Recent Labs Lab 10/12/16 1833  LIPASE 174*  AMYLASE 478*   No results for input(Beasley): AMMONIA in the last 168 hours.  Cardiac Enzymes:  Recent Labs Lab 10/12/16 1833 10/12/16 2244 10/13/16 0413  TROPONINI 0.04* 0.05* 0.04*   BNP (last 3 results)  Recent Labs  10/12/16 1833  BNP 190.0*    ProBNP (last 3 results) No results for input(Beasley): PROBNP in the last 8760 hours.    Studies: No results found.  Scheduled Meds: . chlorhexidine  15 mL Mouth Rinse BID  . heparin  5,000 Units Subcutaneous Q8H  . lidocaine  1 patch Transdermal Q24H  . mouth rinse  15 mL Mouth Rinse q12n4p  . piperacillin-tazobactam (ZOSYN)  IV  3.375 g Intravenous Q8H  . sodium chloride flush  10-40 mL Intracatheter Q12H      Time spent: 25 min  [facility],Melissa Beasley   [facility] Pager [phone]. If 7PM-7AM, please contact night-coverage at [email or web address], Office  [phone]  password [email or web address] 10/15/2016, 3:45 PM  LOS: 3 days

## 2016-10-15 NOTE — Progress Notes (Signed)
[facility] and I placed a call to the Intensivist to notify of high HR and elevated B/P, no orders received, will continue to monitor.

## 2016-10-16 LAB — GLUCOSE, CAPILLARY
GLUCOSE-CAPILLARY: 172 mg/dL — AB (ref 65–99)
GLUCOSE-CAPILLARY: 151 mg/dL — AB (ref 65–99)
GLUCOSE-CAPILLARY: 153 mg/dL — AB (ref 65–99)
GLUCOSE-CAPILLARY: 173 mg/dL — AB (ref 65–99)
GLUCOSE-CAPILLARY: 156 mg/dL — AB (ref 65–99)
GLUCOSE-CAPILLARY: 165 mg/dL — AB (ref 65–99)
GLUCOSE-CAPILLARY: 189 mg/dL — AB (ref 65–99)
GLUCOSE-CAPILLARY: 185 mg/dL — AB (ref 65–99)
GLUCOSE-CAPILLARY: 214 mg/dL — AB (ref 65–99)
GLUCOSE-CAPILLARY: 194 mg/dL — AB (ref 65–99)
GLUCOSE-CAPILLARY: 180 mg/dL — AB (ref 65–99)
GLUCOSE-CAPILLARY: 181 mg/dL — AB (ref 65–99)
Glucose-Capillary: 143 mg/dL — ABNORMAL HIGH (ref 65–99)
Glucose-Capillary: 145 mg/dL — ABNORMAL HIGH (ref 65–99)
Glucose-Capillary: 149 mg/dL — ABNORMAL HIGH (ref 65–99)
Glucose-Capillary: 160 mg/dL — ABNORMAL HIGH (ref 65–99)
Glucose-Capillary: 160 mg/dL — ABNORMAL HIGH (ref 65–99)
Glucose-Capillary: 169 mg/dL — ABNORMAL HIGH (ref 65–99)
Glucose-Capillary: 174 mg/dL — ABNORMAL HIGH (ref 65–99)
Glucose-Capillary: 188 mg/dL — ABNORMAL HIGH (ref 65–99)
Glucose-Capillary: 204 mg/dL — ABNORMAL HIGH (ref 65–99)

## 2016-10-16 LAB — TRIGLYCERIDES
Triglycerides: 966 mg/dL — ABNORMAL HIGH (ref <150)
Triglycerides: 886 mg/dL — ABNORMAL HIGH (ref <150)

## 2016-10-16 LAB — LIPASE, BLOOD: Lipase: 22 U/L (ref 11–51)

## 2016-10-16 MED ORDER — POTASSIUM CHLORIDE CRYS ER 20 MEQ PO TBCR
40.0000 meq | EXTENDED_RELEASE_TABLET | Freq: Once | ORAL | Status: AC
  Administered 2016-10-16: 40 meq via ORAL
  Filled 2016-10-16: qty 2

## 2016-10-16 MED ORDER — HYDROMORPHONE HCL 1 MG/ML IJ SOLN
1.0000 mg | INTRAMUSCULAR | Status: DC | PRN
Start: 2016-10-16 — End: 2016-10-23
  Administered 2016-10-16 – 2016-10-23 (×55): 1 mg via INTRAVENOUS
  Filled 2016-10-16 (×56): qty 1

## 2016-10-16 NOTE — Progress Notes (Signed)
Patient pulled NGTube out.  States that she has been pushing it back and forth in.  Had stressed with patient to be careful of Ngtube and retaped it previously.  Waiting for Dr. To return page.

## 2016-10-16 NOTE — Progress Notes (Signed)
Patient states that she does not want the Ngtube put back in.  Denies any nausea or vomiting.  Only complaint is back pain.  Dr. Eather ColasAware.

## 2016-10-16 NOTE — Progress Notes (Signed)
[facility]  PROGRESS NOTE  Melissa Beasley:[id number] DOB: 1981-01-30 DOA: 10/12/2016   PCP: Irena Reichmann, DO  Brief HPI:   36 y/o female presented  to [place] on Friday w/ 1 day hx of abd pain. She has a PMHx of DM2, and apparently familial hypertriglyceridemia, although it is not known if this was known prior to her presentation. She was found to have pancreatitis with triglycerides of >5500, with a lipase of 450. RUQ showed a fatty liver, but no biliary obstruction. CT and subsequent study showed evolving pancreatic edema, but without necrosis or fluid collection. She was transferred to [facility] for additional therapy.She was initially in ICU, transferred to medical floor and [facility] assumed care on 10/15/16  Subjective   Patient denies chest pain or shortness of breath.   Assessment/Plan:   1. Acute pancreatitis- secondary to familial hypertriglyceridemia, patient presented with triglycerides greater than 5500 and lipase of 450. Lipase is now trending down. Continue with Zosyn at this time.Monitor blood work.  2. Familial hypertriglyceridemia- patient presented with significantly elevated triglycerides greater than 5500, started on IV insulin to acutely reduce  We will keep trending triglycerides daily.  3. Sinus tachycardia- likely from acute pancreatitis, no chest pain this AM 4. Diabetes mellitus- Glucotrol on hold, currently on IV insulin, monitor CBG 5. Left hand swelling- patient had left and swelling/pain and numbness due to IV infiltration, and surgery saw on 10/13/2016 and did not think it was a compartment syndrome. Keep hand elevated above heart, ice as needed. 6. Hypokalemia- supplement, BMP in AM 7. Morbid obesity, in pt with BMI > 36 and underlying DM, Body mass index is 38.72 kg/m.  DVT prophylaxis: Heparin  Code Status: Full code  Family Communication: No family present at bedside   Disposition Plan: Pending improvement in triglycerides   Consultants:  [facility]  M  Procedures:  None  Continuous infusions . dextrose 5 % and 0.45% NaCl 100 mL/hr at 10/16/16 1240  . insulin (NOVOLIN-R) infusion 4.6 Units/hr (10/16/16 1548)   Antibiotics:   Anti-infectives    Start     Dose/Rate Route Frequency Ordered Stop   10/13/16 2100  piperacillin-tazobactam (ZOSYN) IVPB 3.375 g     3.375 g 12.5 mL/hr over 240 Minutes Intravenous Every 8 hours 10/13/16 2008         Objective   Vitals:   10/16/16 0549 10/16/16 0837 10/16/16 1109 10/16/16 1531  BP:  (!) 145/100 (!) 155/95 (!) 154/97  Pulse:  (!) 125 (!) 121 (!) 128  Resp:  (!) 22 20   Temp: 98.8 F (37.1 C) 97.8 F (36.6 C) 98.3 F (36.8 C)   TempSrc: Axillary Axillary Axillary   SpO2:  90% 100% 92%  Weight: 108.8 kg (239 lb 14.4 oz)     Height:        Intake/Output Summary (Last 24 hours) at 10/16/16 1632 Last data filed at 10/16/16 1513  Gross per 24 hour  Intake          2609.96 ml  Output             2175 ml  Net           434.96 ml   Filed Weights   10/13/16 0500 10/15/16 0400 10/16/16 0549  Weight: 110.2 kg (242 lb 15.2 oz) 112.1 kg (247 lb 2.2 oz) 108.8 kg (239 lb 14.4 oz)     Physical Examination:  General exam: Appears calm and comfortable. Respiratory system: Clear to auscultation. Respiratory effort normal.  Cardiovascular system:  RRR. No  murmurs, rubs, gallops. No pedal edema. GI system: Abdomen is nondistended, soft and nontender. No organomegaly.  Central nervous system. No focal neurological deficits. 5 x 5 power in all extremities. Skin: No rashes, lesions or ulcers. Psychiatry: Alert, oriented x 3.Judgement and insight appear normal. Affect normal.    Data Reviewed: I have personally reviewed following labs and imaging studies  CBG:  Recent Labs Lab 10/16/16 1235 10/16/16 1327 10/16/16 1438 10/16/16 1548 10/16/16 1617  GLUCAP 174* 165* 160* 188* 189*    CBC:  Recent Labs Lab 10/12/16 1833 10/13/16 0413  WBC 12.7* 13.3*  NEUTROABS 10.2*  --    HGB 13.1 12.0  HCT 39.9 37.4  MCV 88.5 89.5  PLT 239 259    Basic Metabolic Panel:  Recent Labs Lab 10/12/16 1833  10/13/16 0413 10/13/16 1330 10/13/16 2117 10/14/16 0538 10/15/16 0541  NA 137  < > 135 135 138 138 137  K 3.6  < > 4.1 4.2 3.6 3.3* 3.2*  CL 115*  < > 112* 109 110 109 103  CO2 14*  < > 19* 21* 22 24 28   GLUCOSE 191*  < > 203* 213* 158* 165* 144*  BUN 16  < > 12 11 9 9  5*  CREATININE 1.12*  < > 0.71 0.61 0.65 0.62 0.59  CALCIUM 5.6*  < > 6.7* 6.8* 6.9* 6.6* 7.2*  MG 2.3  --  2.3  --   --  2.3  --   PHOS 1.8*  --  1.2*  --   --  1.2* 2.1*  < > = values in this interval not displayed.  Recent Results (from the past 240 hour(s))  MRSA PCR Screening     Status: None   Collection Time: 10/12/16  5:22 PM  Result Value Ref Range Status   MRSA by PCR NEGATIVE NEGATIVE Final    Comment:        The GeneXpert MRSA Assay (FDA approved for NASAL specimens only), is one component of a comprehensive MRSA colonization surveillance program. It is not intended to diagnose MRSA infection nor to guide or monitor treatment for MRSA infections.   Urine culture     Status: Abnormal   Collection Time: 10/12/16  5:45 PM  Result Value Ref Range Status   Specimen Description URINE, RANDOM  Final   Special Requests NONE  Final   Culture <10,000 COLONIES/mL INSIGNIFICANT GROWTH (A)  Final   Report Status 10/14/2016 FINAL  Final  Culture, blood (routine x 2)     Status: None (Preliminary result)   Collection Time: 10/12/16  6:35 PM  Result Value Ref Range Status   Specimen Description BLOOD RIGHT ANTECUBITAL  Final   Special Requests BOTTLES DRAWN AEROBIC AND ANAEROBIC 5CC  Final   Culture NO GROWTH 4 DAYS  Final   Report Status PENDING  Incomplete  Culture, blood (routine x 2)     Status: None (Preliminary result)   Collection Time: 10/12/16  6:43 PM  Result Value Ref Range Status   Specimen Description BLOOD LEFT ANTECUBITAL  Final   Special Requests BOTTLES DRAWN  AEROBIC AND ANAEROBIC 5CC  Final   Culture NO GROWTH 4 DAYS  Final   Report Status PENDING  Incomplete     Liver Function Tests:  Recent Labs Lab 10/12/16 1833  AST 26  ALT 17  ALKPHOS 31*  BILITOT 0.9  PROT 4.7*  ALBUMIN 1.6*    Recent Labs Lab 10/12/16 1833 10/16/16 0029  LIPASE 174* 22  AMYLASE 478*  --    No results for input(s): AMMONIA in the last 168 hours.  Cardiac Enzymes:  Recent Labs Lab 10/12/16 1833 10/12/16 2244 10/13/16 0413  TROPONINI 0.04* 0.05* 0.04*   BNP (last 3 results)  Recent Labs  10/12/16 1833  BNP 190.0*    ProBNP (last 3 results) No results for input(s): PROBNP in the last 8760 hours.    Studies: No results found.  Scheduled Meds: . chlorhexidine  15 mL Mouth Rinse BID  . heparin  5,000 Units Subcutaneous Q8H  . lidocaine  1 patch Transdermal Q24H  . mouth rinse  15 mL Mouth Rinse q12n4p  . piperacillin-tazobactam (ZOSYN)  IV  3.375 g Intravenous Q8H  . sodium chloride flush  10-40 mL Intracatheter Q12H      Time spent: 25 min  Debbora Presto, MD   [facility] Pager [phone]. If 7PM-7AM, please contact night-coverage at [email or web address], Office  [phone]  password [email or web address] 10/16/2016, 4:32 PM  LOS: 4 days

## 2016-10-17 ENCOUNTER — Encounter (HOSPITAL_COMMUNITY): Payer: Self-pay | Admitting: *Deleted

## 2016-10-17 LAB — CULTURE, BLOOD (ROUTINE X 2)
Culture: NO GROWTH
Culture: NO GROWTH

## 2016-10-17 LAB — GLUCOSE, CAPILLARY
GLUCOSE-CAPILLARY: 142 mg/dL — AB (ref 65–99)
GLUCOSE-CAPILLARY: 122 mg/dL — AB (ref 65–99)
GLUCOSE-CAPILLARY: 141 mg/dL — AB (ref 65–99)
GLUCOSE-CAPILLARY: 161 mg/dL — AB (ref 65–99)
GLUCOSE-CAPILLARY: 174 mg/dL — AB (ref 65–99)
GLUCOSE-CAPILLARY: 153 mg/dL — AB (ref 65–99)
GLUCOSE-CAPILLARY: 193 mg/dL — AB (ref 65–99)
GLUCOSE-CAPILLARY: 162 mg/dL — AB (ref 65–99)
GLUCOSE-CAPILLARY: 148 mg/dL — AB (ref 65–99)
GLUCOSE-CAPILLARY: 165 mg/dL — AB (ref 65–99)
Glucose-Capillary: 134 mg/dL — ABNORMAL HIGH (ref 65–99)
Glucose-Capillary: 136 mg/dL — ABNORMAL HIGH (ref 65–99)
Glucose-Capillary: 148 mg/dL — ABNORMAL HIGH (ref 65–99)
Glucose-Capillary: 152 mg/dL — ABNORMAL HIGH (ref 65–99)
Glucose-Capillary: 153 mg/dL — ABNORMAL HIGH (ref 65–99)
Glucose-Capillary: 161 mg/dL — ABNORMAL HIGH (ref 65–99)
Glucose-Capillary: 163 mg/dL — ABNORMAL HIGH (ref 65–99)
Glucose-Capillary: 164 mg/dL — ABNORMAL HIGH (ref 65–99)
Glucose-Capillary: 190 mg/dL — ABNORMAL HIGH (ref 65–99)

## 2016-10-17 LAB — COMPREHENSIVE METABOLIC PANEL
ALT: 18 U/L (ref 14–54)
AST: 23 U/L (ref 15–41)
Albumin: 1.6 g/dL — ABNORMAL LOW (ref 3.5–5.0)
Alkaline Phosphatase: 60 U/L (ref 38–126)
Anion gap: 9 (ref 5–15)
CHLORIDE: 98 mmol/L — AB (ref 101–111)
CO2: 29 mmol/L (ref 22–32)
CREATININE: 0.51 mg/dL (ref 0.44–1.00)
Calcium: 7.9 mg/dL — ABNORMAL LOW (ref 8.9–10.3)
GFR calc Af Amer: >60 mL/min (ref >60)
GFR calc non Af Amer: >60 mL/min (ref >60)
Glucose, Bld: 135 mg/dL — ABNORMAL HIGH (ref 65–99)
Potassium: 3 mmol/L — ABNORMAL LOW (ref 3.5–5.1)
SODIUM: 136 mmol/L (ref 135–145)
Total Bilirubin: 1 mg/dL (ref 0.3–1.2)
Total Protein: 5.6 g/dL — ABNORMAL LOW (ref 6.5–8.1)

## 2016-10-17 LAB — CBC
HCT: 29.9 % — ABNORMAL LOW (ref 36.0–46.0)
Hemoglobin: 9.4 g/dL — ABNORMAL LOW (ref 12.0–15.0)
MCH: 28.3 pg (ref 26.0–34.0)
MCHC: 31.4 g/dL (ref 30.0–36.0)
MCV: 90.1 fL (ref 78.0–100.0)
PLATELETS: 338 10*3/uL (ref 150–400)
RBC: 3.32 MIL/uL — AB (ref 3.87–5.11)
RDW: 13.9 % (ref 11.5–15.5)
WBC: 14 10*3/uL — ABNORMAL HIGH (ref 4.0–10.5)

## 2016-10-17 LAB — TRIGLYCERIDES: TRIGLYCERIDES: 795 mg/dL — AB (ref <150)

## 2016-10-17 MED ORDER — METOPROLOL TARTRATE 5 MG/5ML IV SOLN
5.0000 mg | INTRAVENOUS | Status: DC | PRN
  Administered 2016-10-17 – 2016-10-22 (×4): 5 mg via INTRAVENOUS
  Filled 2016-10-17 (×4): qty 5

## 2016-10-17 MED ORDER — POTASSIUM CHLORIDE CRYS ER 20 MEQ PO TBCR
40.0000 meq | EXTENDED_RELEASE_TABLET | Freq: Once | ORAL | Status: AC
  Administered 2016-10-17: 40 meq via ORAL
  Filled 2016-10-17: qty 2

## 2016-10-17 NOTE — Progress Notes (Signed)
[facility]  PROGRESS NOTE  Melissa Millmanlicia D Beasley ZOX:[id number] DOB: Oct 01, 1980 DOA: 10/12/2016   PCP: Irena ReichmannOLLINS, DANA, DO  Brief HPI:   36 y/o female presented  to [facility] on Friday w/ 1 day hx of abd pain. She has a PMHx of DM2, and apparently familial hypertriglyceridemia, although it is not known if this was known prior to her presentation. She was found to have pancreatitis with triglycerides of >5500, with a lipase of 450. RUQ showed a fatty liver, but no biliary obstruction. CT and subsequent study showed evolving pancreatic edema, but without necrosis or fluid collection. [facility].She was initially in ICU, transferred to medical floor and [facility] assumed care on 10/15/16  Subjective   Patient denies chest pain or shortness of breath.   Assessment/Plan:   Acute pancreatitis secondary to familial hypertriglyceridemia  - secondary to familial hypertriglyceridemia, patient presented with triglycerides greater than 5500 and lipase of 450 - Lipase is now trending down and is WNL,  - TG's are also trending down - once TG's are < 500 and pt able to tolerate PO, will taper off insulin drip and will start oral anti hypertriglyceridemic agent  - Continue with Zosyn at this time.Monitor blood work.  - keep on clear liquids and once TG's < 500, can try to advance diet   Familial hypertriglyceridemia - patient presented with significantly elevated triglycerides greater than 5500 - started on IV insulin to acutely reduce  We will keep trending triglycerides daily  Sinus tachycardia - likely from acute pancreatitis, no chest pain this AM - will add Metoprolol as needed for HR > 110  Diabetes mellitus - Glucotrol on hold, currently on IV insulin and CBG's stable   Left hand swelling - patient had left and swelling/pain and numbness due to IV infiltration - surgery saw on 10/13/2016 and did not think it was a compartment syndrome - keep hand elevated  above heart, ice as needed.  Hypokalemia - supplement, BMP in AM  Morbid obesity - in pt with BMI > 36 and underlying DM, Body mass index is 38.72 kg/m.  DVT prophylaxis: Heparin  Code Status: Full code  Family Communication: Family present at bedside, mom and dad  Disposition Plan: Pending improvement in triglycerides  [facility]ants:  [facility]  Procedures:  None  Continuous infusions . dextrose 5 % and 0.45% NaCl 100 mL/hr at 10/16/16 2358  . insulin (NOVOLIN-R) infusion 8.6 Units/hr (10/16/16 1829)   Antibiotics:   Anti-infectives    Start     Dose/Rate Route Frequency Ordered Stop   10/13/16 2100  piperacillin-tazobactam (ZOSYN) IVPB 3.375 g     3.375 g 12.5 mL/hr over 240 Minutes Intravenous Every 8 hours 10/13/16 2008         Objective   Vitals:   10/16/16 2300 10/17/16 0000 10/17/16 0335 10/17/16 0803  BP:  (!) 142/86 (!) 141/92 137/85  Pulse: (!) 127 (!) 122 (!) 121 (!) 125  Resp: 19 17 18 20   Temp:  99.2 F (37.3 C) 97.9 F (36.6 C) 98.9 F (37.2 C)  TempSrc:  Axillary Axillary Oral  SpO2: 94% 100% 100% 92%  Weight:   108.9 kg (240 lb 1.3 oz)   Height:        Intake/Output Summary (Last 24 hours) at 10/17/16 1020 Last data filed at 10/17/16 0803  Gross per 24 hour  Intake          2904.31 ml  Output  1150 ml  Net          1754.31 ml   Filed Weights   10/15/16 0400 10/16/16 0549 10/17/16 0335  Weight: 112.1 kg (247 lb 2.2 oz) 108.8 kg (239 lb 14.4 oz) 108.9 kg (240 lb 1.3 oz)     Physical Examination:  General exam: Appears calm and comfortable. Respiratory system: Clear to auscultation. Respiratory effort normal. Cardiovascular system:  RRR. No  murmurs, rubs, gallops. No pedal edema. GI system: Abdomen is nondistended, soft and nontender. No organomegaly.  Central nervous system. No focal neurological deficits. 5 x 5 power in all extremities. Skin: No rashes, lesions or ulcers. Psychiatry: Alert, oriented x 3.Judgement and  insight appear normal. Affect normal.  Data Reviewed: I have personally reviewed following labs and imaging studies  CBG:  Recent Labs Lab 10/17/16 0336 10/17/16 0502 10/17/16 0650 10/17/16 0823 10/17/16 0953  GLUCAP 122* 148* 141* 161* 190*    CBC:  Recent Labs Lab 10/12/16 1833 10/13/16 0413 10/17/16 0340  WBC 12.7* 13.3* 14.0*  NEUTROABS 10.2*  --   --   HGB 13.1 12.0 9.4*  HCT 39.9 37.4 29.9*  MCV 88.5 89.5 90.1  PLT 239 259 338    Basic Metabolic Panel:  Recent Labs Lab 10/12/16 1833  10/13/16 0413 10/13/16 1330 10/13/16 2117 10/14/16 0538 10/15/16 0541 10/17/16 0340  NA 137  < > 135 135 138 138 137 136  K 3.6  < > 4.1 4.2 3.6 3.3* 3.2* 3.0*  CL 115*  < > 112* 109 110 109 103 98*  CO2 14*  < > 19* 21* 22 24 28 29   GLUCOSE 191*  < > 203* 213* 158* 165* 144* 135*  BUN 16  < > 12 11 9 9  5* <5*  CREATININE 1.12*  < > 0.71 0.61 0.65 0.62 0.59 0.51  CALCIUM 5.6*  < > 6.7* 6.8* 6.9* 6.6* 7.2* 7.9*  MG 2.3  --  2.3  --   --  2.3  --   --   PHOS 1.8*  --  1.2*  --   --  1.2* 2.1*  --   < > = values in this interval not displayed.  Recent Results (from the past 240 hour(s))  MRSA PCR Screening     Status: None   Collection Time: 10/12/16  5:22 PM  Result Value Ref Range Status   MRSA by PCR NEGATIVE NEGATIVE Final    Comment:        The GeneXpert MRSA Assay (FDA approved for NASAL specimens only), is one component of a comprehensive MRSA colonization surveillance program. It is not intended to diagnose MRSA infection nor to guide or monitor treatment for MRSA infections.   Urine culture     Status: Abnormal   Collection Time: 10/12/16  5:45 PM  Result Value Ref Range Status   Specimen Description URINE, RANDOM  Final   Special Requests NONE  Final   Culture <10,000 COLONIES/mL INSIGNIFICANT GROWTH (A)  Final   Report Status 10/14/2016 FINAL  Final  Culture, blood (routine x 2)     Status: None (Preliminary result)   Collection Time: 10/12/16   6:35 PM  Result Value Ref Range Status   Specimen Description BLOOD RIGHT ANTECUBITAL  Final   Special Requests BOTTLES DRAWN AEROBIC AND ANAEROBIC 5CC  Final   Culture NO GROWTH 4 DAYS  Final   Report Status PENDING  Incomplete  Culture, blood (routine x 2)     Status: None (Preliminary result)  Collection Time: 10/12/16  6:43 PM  Result Value Ref Range Status   Specimen Description BLOOD LEFT ANTECUBITAL  Final   Special Requests BOTTLES DRAWN AEROBIC AND ANAEROBIC 5CC  Final   Culture NO GROWTH 4 DAYS  Final   Report Status PENDING  Incomplete     Liver Function Tests:  Recent Labs Lab 10/12/16 1833 10/17/16 0340  AST 26 23  ALT 17 18  ALKPHOS 31* 60  BILITOT 0.9 1.0  PROT 4.7* 5.6*  ALBUMIN 1.6* 1.6*    Recent Labs Lab 10/12/16 1833 10/16/16 0029  LIPASE 174* 22  AMYLASE 478*  --    Cardiac Enzymes:  Recent Labs Lab 10/12/16 1833 10/12/16 2244 10/13/16 0413  TROPONINI 0.04* 0.05* 0.04*   BNP (last 3 results)  Recent Labs  10/12/16 1833  BNP 190.0*    Studies: No results found.  Scheduled Meds: . chlorhexidine  15 mL Mouth Rinse BID  . heparin  5,000 Units Subcutaneous Q8H  . lidocaine  1 patch Transdermal Q24H  . mouth rinse  15 mL Mouth Rinse q12n4p  . piperacillin-tazobactam (ZOSYN)  IV  3.375 g Intravenous Q8H  . sodium chloride flush  10-40 mL Intracatheter Q12H   Time spent: 25 min  Debbora Presto, MD   [facility] Pager [phone]. If 7PM-7AM, please contact night-coverage at [email or web address],  Office  [phone] Password [email or web address] 10/17/2016, 10:20 AM  LOS: 5 days

## 2016-10-18 ENCOUNTER — Encounter (HOSPITAL_COMMUNITY): Payer: Self-pay | Admitting: General Practice

## 2016-10-18 ENCOUNTER — Inpatient Hospital Stay (HOSPITAL_COMMUNITY): Payer: BLUE CROSS/BLUE SHIELD

## 2016-10-18 LAB — URINALYSIS, ROUTINE W REFLEX MICROSCOPIC
BILIRUBIN URINE: NEGATIVE
Bacteria, UA: NONE SEEN
Glucose, UA: 50 mg/dL — AB
KETONES UR: NEGATIVE mg/dL
LEUKOCYTES UA: NEGATIVE
NITRITE: NEGATIVE
PROTEIN: 30 mg/dL — AB
Specific Gravity, Urine: 1.012 (ref 1.005–1.030)
pH: 6 (ref 5.0–8.0)

## 2016-10-18 LAB — BASIC METABOLIC PANEL
ANION GAP: 11 (ref 5–15)
CHLORIDE: 94 mmol/L — AB (ref 101–111)
CO2: 29 mmol/L (ref 22–32)
Calcium: 7.9 mg/dL — ABNORMAL LOW (ref 8.9–10.3)
Creatinine, Ser: 0.67 mg/dL (ref 0.44–1.00)
GFR calc Af Amer: >60 mL/min (ref >60)
Glucose, Bld: 159 mg/dL — ABNORMAL HIGH (ref 65–99)
POTASSIUM: 2.9 mmol/L — AB (ref 3.5–5.1)
SODIUM: 134 mmol/L — AB (ref 135–145)

## 2016-10-18 LAB — GLUCOSE, CAPILLARY
GLUCOSE-CAPILLARY: 138 mg/dL — AB (ref 65–99)
GLUCOSE-CAPILLARY: 144 mg/dL — AB (ref 65–99)
GLUCOSE-CAPILLARY: 175 mg/dL — AB (ref 65–99)
Glucose-Capillary: 160 mg/dL — ABNORMAL HIGH (ref 65–99)
Glucose-Capillary: 140 mg/dL — ABNORMAL HIGH (ref 65–99)
Glucose-Capillary: 165 mg/dL — ABNORMAL HIGH (ref 65–99)
Glucose-Capillary: 156 mg/dL — ABNORMAL HIGH (ref 65–99)
Glucose-Capillary: 160 mg/dL — ABNORMAL HIGH (ref 65–99)
Glucose-Capillary: 158 mg/dL — ABNORMAL HIGH (ref 65–99)
Glucose-Capillary: 174 mg/dL — ABNORMAL HIGH (ref 65–99)
Glucose-Capillary: 151 mg/dL — ABNORMAL HIGH (ref 65–99)
Glucose-Capillary: 151 mg/dL — ABNORMAL HIGH (ref 65–99)
Glucose-Capillary: 137 mg/dL — ABNORMAL HIGH (ref 65–99)
Glucose-Capillary: 198 mg/dL — ABNORMAL HIGH (ref 65–99)
Glucose-Capillary: 132 mg/dL — ABNORMAL HIGH (ref 65–99)
Glucose-Capillary: 164 mg/dL — ABNORMAL HIGH (ref 65–99)
Glucose-Capillary: 136 mg/dL — ABNORMAL HIGH (ref 65–99)
Glucose-Capillary: 168 mg/dL — ABNORMAL HIGH (ref 65–99)
Glucose-Capillary: 188 mg/dL — ABNORMAL HIGH (ref 65–99)
Glucose-Capillary: 157 mg/dL — ABNORMAL HIGH (ref 65–99)
Glucose-Capillary: 170 mg/dL — ABNORMAL HIGH (ref 65–99)
Glucose-Capillary: 149 mg/dL — ABNORMAL HIGH (ref 65–99)

## 2016-10-18 LAB — CBC
HCT: 29.2 % — ABNORMAL LOW (ref 36.0–46.0)
HEMOGLOBIN: 9.2 g/dL — AB (ref 12.0–15.0)
MCH: 28.3 pg (ref 26.0–34.0)
MCHC: 31.5 g/dL (ref 30.0–36.0)
MCV: 89.8 fL (ref 78.0–100.0)
PLATELETS: 421 10*3/uL — AB (ref 150–400)
RBC: 3.25 MIL/uL — AB (ref 3.87–5.11)
RDW: 13.8 % (ref 11.5–15.5)
WBC: 17.3 10*3/uL — AB (ref 4.0–10.5)

## 2016-10-18 LAB — TRIGLYCERIDES: TRIGLYCERIDES: 740 mg/dL — AB (ref <150)

## 2016-10-18 LAB — AMYLASE: Amylase: 40 U/L (ref 28–100)

## 2016-10-18 LAB — LACTIC ACID, PLASMA
LACTIC ACID, VENOUS: 0.9 mmol/L (ref 0.5–1.9)
Lactic Acid, Venous: 0.9 mmol/L (ref 0.5–1.9)

## 2016-10-18 LAB — LIPASE, BLOOD: LIPASE: 20 U/L (ref 11–51)

## 2016-10-18 LAB — PROCALCITONIN: Procalcitonin: 0.56 ng/mL

## 2016-10-18 MED ORDER — IOPAMIDOL (ISOVUE-300) INJECTION 61%
INTRAVENOUS | Status: AC
  Administered 2016-10-18: 100 mL
  Filled 2016-10-18: qty 100

## 2016-10-18 NOTE — Evaluation (Signed)
Physical Therapy Evaluation Patient Details Name: Melissa D BarkerNathanial Beasley MRN: [id number] DOB: [birth year] Today's Date: 10/18/2016   History of Present Illness  Patient is a 36 y/o female with hx of pancreatitis, DM presents with abdominal pain. Found to have Acute pancreatitis secondary to familial hypertriglyceridemia. Triglycerides greater than 5500 and lipase of 45  Clinical Impression  Patient presents with chronic back pain, deconditioning and impaired balance s/p above. Tolerated gait training with min guard assist for safety as pt reaching for rails/doors in hallway for support. Encouraged ambulation to bathroom daily to increase activity. Will follow acutely to maximize independence and mobility prior to return home.     Follow Up Recommendations No PT follow up;Supervision - Intermittent    Equipment Recommendations  None recommended by PT    Recommendations for Other Services       Precautions / Restrictions Precautions Precautions: Fall Restrictions Weight Bearing Restrictions: No      Mobility  Bed Mobility Overal bed mobility: Modified Independent                Transfers Overall transfer level: Modified independent               General transfer comment: Stood from EOB without difficulty.   Ambulation/Gait Ambulation/Gait assistance: Supervision Ambulation Distance (Feet): 150 Feet Assistive device: None Gait Pattern/deviations: Step-through pattern;Decreased stride length Gait velocity: decreased Gait velocity interpretation: Below normal speed for age/gender General Gait Details: Slow, guarded gait reaching for doors and rail in hallway for support. no LOB. Ambulated on RA and SP02 100%, HR up to 135 bpm.  Stairs            Wheelchair Mobility    Modified Rankin (Stroke Patients Only)       Balance Overall balance assessment: Needs assistance Sitting-balance support: Feet supported;No upper extremity supported Sitting balance-Leahy  Scale: Good Sitting balance - Comments: Able to donn socks reaching outside BoS without difficulty.    Standing balance support: During functional activity Standing balance-Leahy Scale: Good Standing balance comment: Able to stand statically and donn gown but needs UE suport intermittently during gait.                             Pertinent Vitals/Pain Pain Assessment: 0-10 Pain Score: 10-Worst pain ever Pain Location: back pain- chronic Pain Descriptors / Indicators: Sore;Aching Pain Intervention(s): Patient requesting pain meds-RN notified;Monitored during session;Repositioned    Home Living Family/patient expects to be discharged to:: Private residence Living Arrangements: Children;Parent;Other relatives 47(14 and 487 y/o children) Available Help at Discharge: Family;Available 24 hours/day Type of Home: House Home Access: Stairs to enter Entrance Stairs-Rails: Right Entrance Stairs-Number of Steps: 5 Home Layout: One level Home Equipment: None      Prior Function Level of Independence: Independent         Comments: Has lots of help from in laws and parents.      Hand Dominance        Extremity/Trunk Assessment   Upper Extremity Assessment Upper Extremity Assessment: Defer to OT evaluation    Lower Extremity Assessment Lower Extremity Assessment: Generalized weakness    Cervical / Trunk Assessment Cervical / Trunk Assessment: Normal  Communication   Communication: No difficulties  Cognition Arousal/Alertness: Awake/alert Behavior During Therapy: WFL for tasks assessed/performed Overall Cognitive Status: Within Functional Limits for tasks assessed  General Comments      Exercises     Assessment/Plan    PT Assessment Patient needs continued PT services  PT Problem List Decreased strength;Decreased mobility;Decreased activity tolerance;Pain;Decreased balance          PT Treatment Interventions Gait  training;Therapeutic activities;Therapeutic exercise;Patient/family education;Balance training;Functional mobility training;Stair training    PT Goals (Current goals can be found in the Care Plan section)  Acute Rehab PT Goals Patient Stated Goal: to feel better PT Goal Formulation: With patient Time For Goal Achievement: 11/01/16 Potential to Achieve Goals: Fair    Frequency Min 3X/week   Barriers to discharge        Co-evaluation               End of Session Equipment Utilized During Treatment: Gait belt Activity Tolerance: Patient tolerated treatment well Patient left: in bed;with call bell/phone within reach;with nursing/sitter in room Nurse Communication: Mobility status         Time: 1610-9604 PT Time Calculation (min) (ACUTE ONLY): 15 min   Charges:   PT Evaluation $PT Eval Low Complexity: 1 Procedure     PT G Codes:        Wilbert Schouten A Brittnae Aschenbrenner 10/18/2016, 11:40 AM  Mylo Red, PT, DPT [phone]

## 2016-10-18 NOTE — Progress Notes (Signed)
Pt's potassium is 2.9. Donnamarie PoagK. Kirby NP notified. Awaiting back at this time.

## 2016-10-18 NOTE — Progress Notes (Signed)
PT Cancellation Note  Patient Details Name: Nathanial Millmanlicia D Clopper MRN: [id number] DOB: 02-13-1981   Cancelled Treatment:    Reason Eval/Treat Not Completed: Pain limiting ability to participate Pt reports 10/10 pain in low back. "I should be good to go in about 2 hours." Will follow up as time allows.   Blake DivineShauna A Liany Mumpower 10/18/2016, 8:47 AM Mylo RedShauna Anais Denslow, PT, DPT [phone]

## 2016-10-18 NOTE — Progress Notes (Signed)
[facility]  PROGRESS NOTE  Melissa Beasley ZOX:[id number] DOB: November 21, 1980 DOA: 10/12/2016   PCP: Irena Reichmann, DO  Brief HPI:   36 y/o female presented  to [facility] on Friday w/ 1 day hx of abd pain. She has a PMHx of DM2, and apparently familial hypertriglyceridemia, although it is not known if this was known prior to her presentation. She was found to have pancreatitis with triglycerides of >5500, with a lipase of 450. RUQ showed a fatty liver, but no biliary obstruction. CT and subsequent study showed evolving pancreatic edema, but without necrosis or fluid collection. She was transferred to [facility] for additional therapy.She was initially in ICU, transferred to medical floor and [facility] assumed care on 10/15/16  Subjective   Patient denies chest pain or shortness of breath.   Assessment/Plan:   Acute pancreatitis secondary to familial hypertriglyceridemia  - secondary to familial hypertriglyceridemia, patient presented with triglycerides greater than 5500 and lipase of 450 - Lipase is now trending down and is WNL,  - TG's are also trending down - once TG's are < 500 and pt able to tolerate PO, will taper off insulin drip and will start oral anti hypertriglyceridemic agent  - Continue with Zosyn at this time.Monitor blood work.  - keep on clear liquids and once TG's < 500, can try to advance diet   Fever overnight 2/1 - unclear etiology, pt denies any specific cardiopulmonary symptoms, no urinary or abd concerns - WBC also up from 14 K to 17 K, also with tachycardia and thus meeting sepsis criteria  - will monitor closely - will ask for lactic acid and blood cultures, procalcitonin level, sepsis order set in place  Familial hypertriglyceridemia - patient presented with significantly elevated triglycerides greater than 5500 - started on IV insulin to acutely reduce  We will keep trending triglycerides daily  Sinus tachycardia - likely from acute pancreatitis, no chest pain this  AM - with worsening leukocytosis and fevers, will needs to rule out sepsis  - will add Metoprolol as needed for HR > 110  Diabetes mellitus - Glucotrol on hold, currently on IV insulin and CBG's stable   Left hand swelling - patient had left and swelling/pain and numbness due to IV infiltration - surgery saw on 10/13/2016 and did not think it was a compartment syndrome - keep hand elevated above heart, ice as needed.  Hypokalemia - low this AM, supplement and repeat BMP in AM  Morbid obesity - in pt with BMI > 36 and underlying DM, Body mass index is 38.72 kg/m.  DVT prophylaxis: Heparin  Code Status: Full code  Family Communication: Family present at bedside, mom and dad  Disposition Plan: Pending improvement in triglycerides  Consultants:  [facility] M  Procedures:  None  Continuous infusions . dextrose 5 % and 0.45% NaCl 100 mL/hr at 10/18/16 1615  . insulin (NOVOLIN-R) infusion 2.1 Units/hr (10/18/16 1604)   Antibiotics:   Anti-infectives    Start     Dose/Rate Route Frequency Ordered Stop   10/13/16 2100  piperacillin-tazobactam (ZOSYN) IVPB 3.375 g     3.375 g 12.5 mL/hr over 240 Minutes Intravenous Every 8 hours 10/13/16 2008         Objective   Vitals:   10/18/16 0014 10/18/16 0637 10/18/16 0800 10/18/16 1400  BP: 134/81 130/79  131/77  Pulse: (!) 118 (!) 117  (!) 124  Resp:    (!) 22  Temp: 99.2 F (37.3 C) 98.6 F (37 C) 99.3 F (37.4  C) 98.6 F (37 C)  TempSrc: Oral Oral Oral Oral  SpO2: 99% 96%  96%  Weight:  109.3 kg (240 lb 14.4 oz)    Height:        Intake/Output Summary (Last 24 hours) at 10/18/16 1628 Last data filed at 10/18/16 1500  Gross per 24 hour  Intake          3948.06 ml  Output                0 ml  Net          3948.06 ml   Filed Weights   10/16/16 0549 10/17/16 0335 10/18/16 0637  Weight: 108.8 kg (239 lb 14.4 oz) 108.9 kg (240 lb 1.3 oz) 109.3 kg (240 lb 14.4 oz)     Physical Examination:  General exam: Appears  calm and comfortable. Respiratory system: Clear to auscultation. Respiratory effort normal. Cardiovascular system:  RRR. No  murmurs, rubs, gallops. No pedal edema. GI system: Abdomen is nondistended, soft and nontender. No organomegaly.  Central nervous system. No focal neurological deficits. 5 x 5 power in all extremities. Skin: No rashes, lesions or ulcers. Psychiatry: Alert, oriented x 3.Judgement and insight appear normal. Affect normal.  Data Reviewed: I have personally reviewed following labs and imaging studies  CBG:  Recent Labs Lab 10/18/16 1133 10/18/16 1235 10/18/16 1356 10/18/16 1502 10/18/16 1604  GLUCAP 151* 137* 198* 164* 132*    CBC:  Recent Labs Lab 10/12/16 1833 10/13/16 0413 10/17/16 0340 10/18/16 0529  WBC 12.7* 13.3* 14.0* 17.3*  NEUTROABS 10.2*  --   --   --   HGB 13.1 12.0 9.4* 9.2*  HCT 39.9 37.4 29.9* 29.2*  MCV 88.5 89.5 90.1 89.8  PLT 239 259 338 421*    Basic Metabolic Panel:  Recent Labs Lab 10/12/16 1833  10/13/16 0413  10/13/16 2117 10/14/16 0538 10/15/16 0541 10/17/16 0340 10/18/16 0529  NA 137  < > 135  < > 138 138 137 136 134*  K 3.6  < > 4.1  < > 3.6 3.3* 3.2* 3.0* 2.9*  CL 115*  < > 112*  < > 110 109 103 98* 94*  CO2 14*  < > 19*  < > 22 24 28 29 29   GLUCOSE 191*  < > 203*  < > 158* 165* 144* 135* 159*  BUN 16  < > 12  < > 9 9 5* <5* <5*  CREATININE 1.12*  < > 0.71  < > 0.65 0.62 0.59 0.51 0.67  CALCIUM 5.6*  < > 6.7*  < > 6.9* 6.6* 7.2* 7.9* 7.9*  MG 2.3  --  2.3  --   --  2.3  --   --   --   PHOS 1.8*  --  1.2*  --   --  1.2* 2.1*  --   --   < > = values in this interval not displayed.  Recent Results (from the past 240 hour(s))  MRSA PCR Screening     Status: None   Collection Time: 10/12/16  5:22 PM  Result Value Ref Range Status   MRSA by PCR NEGATIVE NEGATIVE Final    Comment:        The GeneXpert MRSA Assay (FDA approved for NASAL specimens only), is one component of a comprehensive MRSA  colonization surveillance program. It is not intended to diagnose MRSA infection nor to guide or monitor treatment for MRSA infections.   Urine culture     Status:  Abnormal   Collection Time: 10/12/16  5:45 PM  Result Value Ref Range Status   Specimen Description URINE, RANDOM  Final   Special Requests NONE  Final   Culture <10,000 COLONIES/mL INSIGNIFICANT GROWTH (A)  Final   Report Status 10/14/2016 FINAL  Final  Culture, blood (routine x 2)     Status: None   Collection Time: 10/12/16  6:35 PM  Result Value Ref Range Status   Specimen Description BLOOD RIGHT ANTECUBITAL  Final   Special Requests BOTTLES DRAWN AEROBIC AND ANAEROBIC 5CC  Final   Culture NO GROWTH 5 DAYS  Final   Report Status 10/17/2016 FINAL  Final  Culture, blood (routine x 2)     Status: None   Collection Time: 10/12/16  6:43 PM  Result Value Ref Range Status   Specimen Description BLOOD LEFT ANTECUBITAL  Final   Special Requests BOTTLES DRAWN AEROBIC AND ANAEROBIC 5CC  Final   Culture NO GROWTH 5 DAYS  Final   Report Status 10/17/2016 FINAL  Final     Liver Function Tests:  Recent Labs Lab 10/12/16 1833 10/17/16 0340  AST 26 23  ALT 17 18  ALKPHOS 31* 60  BILITOT 0.9 1.0  PROT 4.7* 5.6*  ALBUMIN 1.6* 1.6*    Recent Labs Lab 10/12/16 1833 10/16/16 0029 10/18/16 0529  LIPASE 174* 22 20  AMYLASE 478*  --  40   Cardiac Enzymes:  Recent Labs Lab 10/12/16 1833 10/12/16 2244 10/13/16 0413  TROPONINI 0.04* 0.05* 0.04*   BNP (last 3 results)  Recent Labs  10/12/16 1833  BNP 190.0*    Studies: No results found.  Scheduled Meds: . chlorhexidine  15 mL Mouth Rinse BID  . heparin  5,000 Units Subcutaneous Q8H  . lidocaine  1 patch Transdermal Q24H  . mouth rinse  15 mL Mouth Rinse q12n4p  . piperacillin-tazobactam (ZOSYN)  IV  3.375 g Intravenous Q8H  . sodium chloride flush  10-40 mL Intracatheter Q12H   Time spent: 25 min  Debbora Presto, MD   [facility] Pager [phone]. If 7PM-7AM, please contact night-coverage at [email or web address],  Office  [phone] Password [email or web address] 10/18/2016, 4:28 PM  LOS: 6 days

## 2016-10-19 LAB — CBC
HCT: 26 % — ABNORMAL LOW (ref 36.0–46.0)
Hemoglobin: 8.1 g/dL — ABNORMAL LOW (ref 12.0–15.0)
MCH: 27.8 pg (ref 26.0–34.0)
MCHC: 31.2 g/dL (ref 30.0–36.0)
MCV: 89.3 fL (ref 78.0–100.0)
PLATELETS: 384 10*3/uL (ref 150–400)
RBC: 2.91 MIL/uL — AB (ref 3.87–5.11)
RDW: 13.9 % (ref 11.5–15.5)
WBC: 14.2 10*3/uL — ABNORMAL HIGH (ref 4.0–10.5)

## 2016-10-19 LAB — GLUCOSE, CAPILLARY
GLUCOSE-CAPILLARY: 153 mg/dL — AB (ref 65–99)
GLUCOSE-CAPILLARY: 155 mg/dL — AB (ref 65–99)
GLUCOSE-CAPILLARY: 155 mg/dL — AB (ref 65–99)
GLUCOSE-CAPILLARY: 156 mg/dL — AB (ref 65–99)
GLUCOSE-CAPILLARY: 158 mg/dL — AB (ref 65–99)
GLUCOSE-CAPILLARY: 160 mg/dL — AB (ref 65–99)
GLUCOSE-CAPILLARY: 164 mg/dL — AB (ref 65–99)
GLUCOSE-CAPILLARY: 174 mg/dL — AB (ref 65–99)
GLUCOSE-CAPILLARY: 176 mg/dL — AB (ref 65–99)
GLUCOSE-CAPILLARY: 178 mg/dL — AB (ref 65–99)
Glucose-Capillary: 160 mg/dL — ABNORMAL HIGH (ref 65–99)
Glucose-Capillary: 162 mg/dL — ABNORMAL HIGH (ref 65–99)
Glucose-Capillary: 169 mg/dL — ABNORMAL HIGH (ref 65–99)
Glucose-Capillary: 152 mg/dL — ABNORMAL HIGH (ref 65–99)
Glucose-Capillary: 165 mg/dL — ABNORMAL HIGH (ref 65–99)

## 2016-10-19 LAB — BASIC METABOLIC PANEL
Anion gap: 10 (ref 5–15)
CO2: 31 mmol/L (ref 22–32)
Calcium: 7.9 mg/dL — ABNORMAL LOW (ref 8.9–10.3)
Chloride: 95 mmol/L — ABNORMAL LOW (ref 101–111)
Creatinine, Ser: 0.64 mg/dL (ref 0.44–1.00)
GFR calc Af Amer: >60 mL/min (ref >60)
GLUCOSE: 152 mg/dL — AB (ref 65–99)
Potassium: 3.3 mmol/L — ABNORMAL LOW (ref 3.5–5.1)
Sodium: 136 mmol/L (ref 135–145)

## 2016-10-19 LAB — URINE CULTURE

## 2016-10-19 LAB — TRIGLYCERIDES: Triglycerides: 571 mg/dL — ABNORMAL HIGH (ref <150)

## 2016-10-19 LAB — STREP PNEUMONIAE URINARY ANTIGEN: STREP PNEUMO URINARY ANTIGEN: NEGATIVE

## 2016-10-19 MED ORDER — FLUCONAZOLE 100 MG PO TABS
100.0000 mg | ORAL_TABLET | Freq: Every day | ORAL | Status: AC
  Administered 2016-10-19 – 2016-10-23 (×5): 100 mg via ORAL
  Filled 2016-10-19 (×5): qty 1

## 2016-10-19 MED ORDER — GEMFIBROZIL 600 MG PO TABS
600.0000 mg | ORAL_TABLET | Freq: Two times a day (BID) | ORAL | Status: DC
  Administered 2016-10-19 – 2016-10-24 (×10): 600 mg via ORAL
  Filled 2016-10-19 (×11): qty 1

## 2016-10-19 MED ORDER — INSULIN ASPART 100 UNIT/ML ~~LOC~~ SOLN
0.0000 [IU] | Freq: Three times a day (TID) | SUBCUTANEOUS | Status: DC
  Administered 2016-10-19 (×2): 2 [IU] via SUBCUTANEOUS
  Administered 2016-10-20: 3 [IU] via SUBCUTANEOUS
  Administered 2016-10-20 (×2): 2 [IU] via SUBCUTANEOUS
  Administered 2016-10-21: 3 [IU] via SUBCUTANEOUS
  Administered 2016-10-21: 2 [IU] via SUBCUTANEOUS
  Administered 2016-10-21 – 2016-10-22 (×2): 3 [IU] via SUBCUTANEOUS
  Administered 2016-10-22: 2 [IU] via SUBCUTANEOUS
  Administered 2016-10-22 – 2016-10-23 (×2): 3 [IU] via SUBCUTANEOUS
  Administered 2016-10-23: 1 [IU] via SUBCUTANEOUS
  Administered 2016-10-23 – 2016-10-24 (×2): 2 [IU] via SUBCUTANEOUS

## 2016-10-19 MED ORDER — POTASSIUM CHLORIDE CRYS ER 20 MEQ PO TBCR
40.0000 meq | EXTENDED_RELEASE_TABLET | Freq: Once | ORAL | Status: AC
  Administered 2016-10-19: 40 meq via ORAL
  Filled 2016-10-19: qty 2

## 2016-10-19 MED ORDER — VANCOMYCIN HCL 10 G IV SOLR
2000.0000 mg | Freq: Once | INTRAVENOUS | Status: AC
  Administered 2016-10-19: 2000 mg via INTRAVENOUS
  Filled 2016-10-19: qty 2000

## 2016-10-19 MED ORDER — VANCOMYCIN HCL IN DEXTROSE 1-5 GM/200ML-% IV SOLN
1000.0000 mg | Freq: Three times a day (TID) | INTRAVENOUS | Status: DC
  Administered 2016-10-20 – 2016-10-22 (×7): 1000 mg via INTRAVENOUS
  Filled 2016-10-19 (×9): qty 200

## 2016-10-19 NOTE — Progress Notes (Addendum)
[facility]  PROGRESS NOTE  Melissa Beasley ZOX:[id number] DOB: December 14, 1980 DOA: 10/12/2016   PCP: Irena Reichmann, DO  Brief HPI:   37 y/o female presented  to [facility] on Friday w/ 1 day hx of abd pain. She has a PMHx of DM2, and apparently familial hypertriglyceridemia, although it is not known if this was known prior to her presentation. She was found to have pancreatitis with triglycerides of >5500, with a lipase of 450. RUQ showed a fatty liver, but no biliary obstruction. CT and subsequent study showed evolving pancreatic edema, but without necrosis or fluid collection. She was transferred to [facility] for additional therapy.She was initially in ICU, transferred to medical floor and [facility] assumed care on 10/15/16  Subjective   Patient denies chest pain or shortness of breath.   Assessment/Plan:   Acute pancreatitis secondary to familial hypertriglyceridemia  - secondary to familial hypertriglyceridemia, patient presented with triglycerides greater than 5500 and lipase of 450 - Lipase is now trending down and is WNL - TG's are also trending down, ~500 this AM  - CT abd confirms significant inflammation but no signs of pseudocyst  - pt has so far tolerated PO clear liquids well - has been on IV Insulin and fluids and now with some volume overload - will try to take off Insulin drip and IVF, advance diet to full liquids, add Gimfibrozil 600 mg BID - repeat TG and lipase in Am along with BMP   Fever overnight 2/1 - unclear etiology, pt denies any specific cardiopulmonary symptoms, no urinary or abd concerns - WBC also up from 14 K to 17 K, also with tachycardia and thus meeting sepsis criteria  - lactic acid is WNL which is reassuring and Procalcitonin level is trending down since one week ago - CT chest worrisome for development of bilateral PNA, I will add vancomycin to Zosyn which pt is already on   Familial hypertriglyceridemia - patient presented with significantly elevated  triglycerides greater than 5500 - treatment as noted above   Sinus tachycardia - likely from acute pancreatitis, no chest pain this AM - added Metoprolol as needed for HR > 110 - keep on tele for now   UTI, yeast - added Diflucan   Diabetes mellitus - Glucotrol on hold, added SSI for now   Left hand swelling - patient had left and swelling/pain and numbness due to IV infiltration - surgery saw on 10/13/2016 and did not think it was a compartment syndrome - keep hand elevated above heart, ice as needed.  Hypokalemia - low this AM, supplement and repeat BMP in AM  Morbid obesity - in pt with BMI > 36 and underlying DM, Body mass index is 38.72 kg/m.  DVT prophylaxis: Heparin  Code Status: Full code  Family Communication: Family present at bedside, mom and dad  Disposition Plan: Pending improvement in triglycerides  Consultants:  PCCM  Procedures:  None  Antibiotics:  Zosyn 1/28 -->  Vancomycin 2/3 -->  Diflucan 2/3 -->  Objective   Vitals:   10/19/16 1411 10/19/16 1448 10/19/16 1533 10/19/16 1600  BP: 110/75  99/81 104/76  Pulse: (!) 132   (!) 116  Resp: 16   17  Temp: (!) 103 F (39.4 C) (!) 101.8 F (38.8 C) (!) 101.1 F (38.4 C) 99 F (37.2 C)  TempSrc: Oral Oral Oral Oral  SpO2: 92%  94% 99%  Weight:      Height:        Intake/Output Summary (Last 24 hours)  at 10/19/16 1659 Last data filed at 10/19/16 1300  Gross per 24 hour  Intake          1503.03 ml  Output                1 ml  Net          1502.03 ml   Filed Weights   10/17/16 0335 10/18/16 0637 10/19/16 0604  Weight: 108.9 kg (240 lb 1.3 oz) 109.3 kg (240 lb 14.4 oz) 112.4 kg (247 lb 12.8 oz)     Physical Examination:  General exam: Appears calm and comfortable. Respiratory system: Clear to auscultation. Respiratory effort normal. Cardiovascular system:  RRR. No  murmurs, rubs, gallops. No pedal edema. GI system: Abdomen is nondistended, soft and nontender. No organomegaly.   Central nervous system. No focal neurological deficits. 5 x 5 power in all extremities. Skin: No rashes, lesions or ulcers. Psychiatry: Alert, oriented x 3.Judgement and insight appear normal. Affect normal.  Data Reviewed: I have personally reviewed following labs and imaging studies  CBG:  Recent Labs Lab 10/19/16 0756 10/19/16 0916 10/19/16 1017 10/19/16 1123 10/19/16 1657  GLUCAP 155* 174* 164* 165* 176*    CBC:  Recent Labs Lab 10/12/16 1833 10/13/16 0413 10/17/16 0340 10/18/16 0529 10/19/16 0616  WBC 12.7* 13.3* 14.0* 17.3* 14.2*  NEUTROABS 10.2*  --   --   --   --   HGB 13.1 12.0 9.4* 9.2* 8.1*  HCT 39.9 37.4 29.9* 29.2* 26.0*  MCV 88.5 89.5 90.1 89.8 89.3  PLT 239 259 338 421* 384    Basic Metabolic Panel:  Recent Labs Lab 10/12/16 1833  10/13/16 0413  10/14/16 0538 10/15/16 0541 10/17/16 0340 10/18/16 0529 10/19/16 0616  NA 137  < > 135  < > 138 137 136 134* 136  K 3.6  < > 4.1  < > 3.3* 3.2* 3.0* 2.9* 3.3*  CL 115*  < > 112*  < > 109 103 98* 94* 95*  CO2 14*  < > 19*  < > 24 28 29 29 31   GLUCOSE 191*  < > 203*  < > 165* 144* 135* 159* 152*  BUN 16  < > 12  < > 9 5* <5* <5* <5*  CREATININE 1.12*  < > 0.71  < > 0.62 0.59 0.51 0.67 0.64  CALCIUM 5.6*  < > 6.7*  < > 6.6* 7.2* 7.9* 7.9* 7.9*  MG 2.3  --  2.3  --  2.3  --   --   --   --   PHOS 1.8*  --  1.2*  --  1.2* 2.1*  --   --   --   < > = values in this interval not displayed.  Recent Results (from the past 240 hour(s))  MRSA PCR Screening     Status: None   Collection Time: 10/12/16  5:22 PM  Result Value Ref Range Status   MRSA by PCR NEGATIVE NEGATIVE Final    Comment:        The GeneXpert MRSA Assay (FDA approved for NASAL specimens only), is one component of a comprehensive MRSA colonization surveillance program. It is not intended to diagnose MRSA infection nor to guide or monitor treatment for MRSA infections.   Urine culture     Status: Abnormal   Collection Time: 10/12/16   5:45 PM  Result Value Ref Range Status   Specimen Description URINE, RANDOM  Final   Special Requests NONE  Final  Culture <10,000 COLONIES/mL INSIGNIFICANT GROWTH (A)  Final   Report Status 10/14/2016 FINAL  Final  Culture, blood (routine x 2)     Status: None   Collection Time: 10/12/16  6:35 PM  Result Value Ref Range Status   Specimen Description BLOOD RIGHT ANTECUBITAL  Final   Special Requests BOTTLES DRAWN AEROBIC AND ANAEROBIC 5CC  Final   Culture NO GROWTH 5 DAYS  Final   Report Status 10/17/2016 FINAL  Final  Culture, blood (routine x 2)     Status: None   Collection Time: 10/12/16  6:43 PM  Result Value Ref Range Status   Specimen Description BLOOD LEFT ANTECUBITAL  Final   Special Requests BOTTLES DRAWN AEROBIC AND ANAEROBIC 5CC  Final   Culture NO GROWTH 5 DAYS  Final   Report Status 10/17/2016 FINAL  Final  Culture, blood (x 2)     Status: None (Preliminary result)   Collection Time: 10/18/16  4:55 PM  Result Value Ref Range Status   Specimen Description BLOOD LEFT ARM  Final   Special Requests BOTTLES DRAWN AEROBIC ONLY 5CC  Final   Culture NO GROWTH < 24 HOURS  Final   Report Status PENDING  Incomplete  Culture, blood (x 2)     Status: None (Preliminary result)   Collection Time: 10/18/16  4:59 PM  Result Value Ref Range Status   Specimen Description BLOOD RIGHT ARM  Final   Special Requests IN PEDIATRIC BOTTLE 3CC  Final   Culture NO GROWTH < 24 HOURS  Final   Report Status PENDING  Incomplete  Culture, Urine     Status: Abnormal   Collection Time: 10/18/16  5:45 PM  Result Value Ref Range Status   Specimen Description URINE, RANDOM  Final   Special Requests NONE  Final   Culture 60,000 COLONIES/mL YEAST (A)  Final   Report Status 10/19/2016 FINAL  Final     Liver Function Tests:  Recent Labs Lab 10/12/16 1833 10/17/16 0340  AST 26 23  ALT 17 18  ALKPHOS 31* 60  BILITOT 0.9 1.0  PROT 4.7* 5.6*  ALBUMIN 1.6* 1.6*    Recent Labs Lab  10/12/16 1833 10/16/16 0029 10/18/16 0529  LIPASE 174* 22 20  AMYLASE 478*  --  40   Cardiac Enzymes:  Recent Labs Lab 10/12/16 1833 10/12/16 2244 10/13/16 0413  TROPONINI 0.04* 0.05* 0.04*   BNP (last 3 results)  Recent Labs  10/12/16 1833  BNP 190.0*    Studies: Ct Chest W Contrast  Result Date: 10/18/2016 CLINICAL DATA:  Concern for sepsis in patient with pancreatitis. Further evaluation requested. Initial encounter. EXAM: CT CHEST, ABDOMEN, AND PELVIS WITH CONTRAST TECHNIQUE: Multidetector CT imaging of the chest, abdomen and pelvis was performed following the standard protocol during bolus administration of intravenous contrast. CONTRAST:  100 mL ISOVUE-300 IOPAMIDOL (ISOVUE-300) INJECTION 61% COMPARISON:  CT of the abdomen and pelvis performed 10/11/2016, and chest radiograph performed 10/13/2016 FINDINGS: CT CHEST FINDINGS Cardiovascular: The heart is normal in size. Scattered coronary artery calcifications are seen. A right IJ line is noted ending about the distal SVC. The thoracic aorta is grossly unremarkable. The great vessels are grossly unremarkable. Mediastinum/Nodes: The mediastinum is grossly unremarkable in appearance. No mediastinal lymphadenopathy is seen. No pericardial effusion is identified. The visualized portions of the thyroid gland are unremarkable. No axillary lymphadenopathy is seen. A small nodule at the right breast is grossly stable from 2008 and likely reflects normal fibroglandular tissue. Lungs/Pleura: Small left and trace  right pleural effusions are noted. Bibasilar airspace opacities may reflect atelectasis or possibly pneumonia. No pneumothorax is seen. No dominant mass is identified. Musculoskeletal: No acute osseous abnormalities are identified. The visualized musculature is unremarkable in appearance. CT ABDOMEN PELVIS FINDINGS Hepatobiliary: There is diffuse fatty infiltration within the liver. The gallbladder is partially filled with contrast. The  common bile duct is not well characterized. Pancreas: There is diffuse soft tissue inflammation about the pancreas, with associated fluid tracking about the stomach, small bowel loops, liver, spleen, and along both paracolic gutters into the pelvis. This is compatible with relatively severe acute pancreatitis. No well defined pseudocyst is seen. There is no definite evidence of devascularization at this time. Spleen: The spleen is grossly unremarkable in appearance. Adrenals/Urinary Tract: The adrenal glands are unremarkable in appearance. The kidneys are within normal limits. There is no evidence of hydronephrosis. No renal or ureteral stones are identified. Nonspecific perinephric stranding is noted bilaterally. Stomach/Bowel: The stomach is unremarkable in appearance. The small bowel is within normal limits. The appendix is normal in caliber, without evidence of appendicitis. The colon is unremarkable in appearance. Vascular/Lymphatic: Minimal calcification is seen along the abdominal aorta. No retroperitoneal or pelvic sidewall lymphadenopathy is seen. Reproductive: The bladder is mildly distended and grossly unremarkable. The uterus is grossly unremarkable. The ovaries are relatively symmetric. No suspicious adnexal masses are seen. Other: Diffuse soft tissue edema is seen along the abdominal and pelvic wall, likely reflecting anasarca. A small anterior abdominal wall hernia is seen just superior to the umbilicus, containing only fat. Musculoskeletal: No acute osseous abnormalities are identified. Vacuum phenomenon is noted at L5-S1. The visualized musculature is unremarkable in appearance. IMPRESSION: 1. Acute pancreatitis, with diffuse soft tissue inflammation about the pancreas, and small to moderate volume ascites within the abdomen and pelvis. No well defined pseudocyst seen. No definite evidence of devascularization at this time. 2. Small left and trace right pleural effusions. Bibasilar airspace  opacities may reflect atelectasis or possibly pneumonia. 3. Diffuse soft tissue edema along the abdominal and pelvic wall, likely reflecting anasarca. 4. Scattered coronary artery calcifications seen. 5. Diffuse fatty infiltration within the liver. 6. Small anterior abdominal wall hernia just superior to the umbilicus, containing only fat. Electronically Signed   By: Roanna Raider M.D.   On: 10/18/2016 22:29   Ct Abdomen Pelvis W Contrast  Result Date: 10/18/2016 CLINICAL DATA:  Concern for sepsis in patient with pancreatitis. Further evaluation requested. Initial encounter. EXAM: CT CHEST, ABDOMEN, AND PELVIS WITH CONTRAST TECHNIQUE: Multidetector CT imaging of the chest, abdomen and pelvis was performed following the standard protocol during bolus administration of intravenous contrast. CONTRAST:  100 mL ISOVUE-300 IOPAMIDOL (ISOVUE-300) INJECTION 61% COMPARISON:  CT of the abdomen and pelvis performed 10/11/2016, and chest radiograph performed 10/13/2016 FINDINGS: CT CHEST FINDINGS Cardiovascular: The heart is normal in size. Scattered coronary artery calcifications are seen. A right IJ line is noted ending about the distal SVC. The thoracic aorta is grossly unremarkable. The great vessels are grossly unremarkable. Mediastinum/Nodes: The mediastinum is grossly unremarkable in appearance. No mediastinal lymphadenopathy is seen. No pericardial effusion is identified. The visualized portions of the thyroid gland are unremarkable. No axillary lymphadenopathy is seen. A small nodule at the right breast is grossly stable from 2008 and likely reflects normal fibroglandular tissue. Lungs/Pleura: Small left and trace right pleural effusions are noted. Bibasilar airspace opacities may reflect atelectasis or possibly pneumonia. No pneumothorax is seen. No dominant mass is identified. Musculoskeletal: No acute osseous abnormalities are identified.  The visualized musculature is unremarkable in appearance. CT ABDOMEN  PELVIS FINDINGS Hepatobiliary: There is diffuse fatty infiltration within the liver. The gallbladder is partially filled with contrast. The common bile duct is not well characterized. Pancreas: There is diffuse soft tissue inflammation about the pancreas, with associated fluid tracking about the stomach, small bowel loops, liver, spleen, and along both paracolic gutters into the pelvis. This is compatible with relatively severe acute pancreatitis. No well defined pseudocyst is seen. There is no definite evidence of devascularization at this time. Spleen: The spleen is grossly unremarkable in appearance. Adrenals/Urinary Tract: The adrenal glands are unremarkable in appearance. The kidneys are within normal limits. There is no evidence of hydronephrosis. No renal or ureteral stones are identified. Nonspecific perinephric stranding is noted bilaterally. Stomach/Bowel: The stomach is unremarkable in appearance. The small bowel is within normal limits. The appendix is normal in caliber, without evidence of appendicitis. The colon is unremarkable in appearance. Vascular/Lymphatic: Minimal calcification is seen along the abdominal aorta. No retroperitoneal or pelvic sidewall lymphadenopathy is seen. Reproductive: The bladder is mildly distended and grossly unremarkable. The uterus is grossly unremarkable. The ovaries are relatively symmetric. No suspicious adnexal masses are seen. Other: Diffuse soft tissue edema is seen along the abdominal and pelvic wall, likely reflecting anasarca. A small anterior abdominal wall hernia is seen just superior to the umbilicus, containing only fat. Musculoskeletal: No acute osseous abnormalities are identified. Vacuum phenomenon is noted at L5-S1. The visualized musculature is unremarkable in appearance. IMPRESSION: 1. Acute pancreatitis, with diffuse soft tissue inflammation about the pancreas, and small to moderate volume ascites within the abdomen and pelvis. No well defined  pseudocyst seen. No definite evidence of devascularization at this time. 2. Small left and trace right pleural effusions. Bibasilar airspace opacities may reflect atelectasis or possibly pneumonia. 3. Diffuse soft tissue edema along the abdominal and pelvic wall, likely reflecting anasarca. 4. Scattered coronary artery calcifications seen. 5. Diffuse fatty infiltration within the liver. 6. Small anterior abdominal wall hernia just superior to the umbilicus, containing only fat. Electronically Signed   By: Roanna Raider M.D.   On: 10/18/2016 22:29    Scheduled Meds: . chlorhexidine  15 mL Mouth Rinse BID  . gemfibrozil  600 mg Oral BID AC  . heparin  5,000 Units Subcutaneous Q8H  . insulin aspart  0-9 Units Subcutaneous TID WC  . lidocaine  1 patch Transdermal Q24H  . mouth rinse  15 mL Mouth Rinse q12n4p  . piperacillin-tazobactam (ZOSYN)  IV  3.375 g Intravenous Q8H  . potassium chloride  40 mEq Oral Once  . sodium chloride flush  10-40 mL Intracatheter Q12H   Time spent: 25 min  Debbora Presto, MD   [facility] Pager [phone]. If 7PM-7AM, please contact night-coverage at [email or web address],  Office  [phone] Password [email or web address] 10/19/2016, 4:59 PM  LOS: 7 days

## 2016-10-19 NOTE — Progress Notes (Signed)
Pharmacy Antibiotic Note  Melissa Beasley is a 36 y.o. female admitted on 10/12/2016 with abdominal pain. Pharmacy has been dosing Zosyn for acute pancreatitis and now to add Vancomycin for pneumonia coverage. Wt: 112.4 kg, SCr 0.64, CrCl~100 ml/min.   Plan: 1. Vancomycin 2000 mg IV x 1 dose to load followed by Vancomycin 1g IV every 8 hours 2. Will continue to follow renal function, culture results, LOT, and antibiotic de-escalation plans   Height: 5\' 6"  (167.6 cm) Weight: 247 lb 12.8 oz (112.4 kg) IBW/kg (Calculated) : 59.3  Temp (24hrs), Avg:99.9 F (37.7 C), Min:97.3 F (36.3 C), Max:103 F (39.4 C)   Recent Labs Lab 10/12/16 1833 10/12/16 2244 10/13/16 0413  10/14/16 0538 10/15/16 0541 10/17/16 0340 10/18/16 0529 10/18/16 1659 10/18/16 1933 10/19/16 0616  WBC 12.7*  --  13.3*  --   --   --  14.0* 17.3*  --   --  14.2*  CREATININE 1.12* 1.02* 0.71  < > 0.62 0.59 0.51 0.67  --   --  0.64  LATICACIDVEN 1.0 1.0  --   --   --   --   --   --  0.9 0.9  --   < > = values in this interval not displayed.  Estimated Creatinine Clearance: 124.7 mL/min (by C-G formula based on SCr of 0.64 mg/dL).    No Known Allergies  Antimicrobials this admission: Zosyn 1/28 >> Vanc 2/3 >>  Microbiology results: 1/27 MRSA PCR >> neg 1/27 BCx >> NG 2/2 BCx >> ngtd 2/2 UCx >> 60k yeast 2/3 RCx >> ordered  Thank you for allowing pharmacy to be a part of this patient's care.  Georgina PillionElizabeth Teyana Pierron, PharmD, BCPS Clinical Pharmacist Pager: [phone] 10/19/2016 5:37 PM

## 2016-10-19 NOTE — Progress Notes (Signed)
Dr. Toniann FailKakrakandy notified that pt is R/O sepsis and tachycardic.  Asked if I could place pt on telemetry.  Dr. Toniann FailKakrakandy called and gave me a verbal order to place pt on telemetry.  Verbal order read back and verified.

## 2016-10-20 LAB — CBC
HCT: 25.8 % — ABNORMAL LOW (ref 36.0–46.0)
HEMOGLOBIN: 7.9 g/dL — AB (ref 12.0–15.0)
MCH: 27.5 pg (ref 26.0–34.0)
MCHC: 30.6 g/dL (ref 30.0–36.0)
MCV: 89.9 fL (ref 78.0–100.0)
Platelets: 373 10*3/uL (ref 150–400)
RBC: 2.87 MIL/uL — AB (ref 3.87–5.11)
RDW: 13.9 % (ref 11.5–15.5)
WBC: 13.6 10*3/uL — ABNORMAL HIGH (ref 4.0–10.5)

## 2016-10-20 LAB — BASIC METABOLIC PANEL
Anion gap: 7 (ref 5–15)
CHLORIDE: 98 mmol/L — AB (ref 101–111)
CO2: 31 mmol/L (ref 22–32)
Calcium: 7.9 mg/dL — ABNORMAL LOW (ref 8.9–10.3)
Creatinine, Ser: 0.6 mg/dL (ref 0.44–1.00)
GFR calc Af Amer: >60 mL/min (ref >60)
GFR calc non Af Amer: >60 mL/min (ref >60)
Glucose, Bld: 215 mg/dL — ABNORMAL HIGH (ref 65–99)
POTASSIUM: 3.3 mmol/L — AB (ref 3.5–5.1)
SODIUM: 136 mmol/L (ref 135–145)

## 2016-10-20 LAB — TRIGLYCERIDES: TRIGLYCERIDES: 531 mg/dL — AB (ref <150)

## 2016-10-20 LAB — GLUCOSE, CAPILLARY
GLUCOSE-CAPILLARY: 186 mg/dL — AB (ref 65–99)
GLUCOSE-CAPILLARY: 257 mg/dL — AB (ref 65–99)
Glucose-Capillary: 216 mg/dL — ABNORMAL HIGH (ref 65–99)
Glucose-Capillary: 188 mg/dL — ABNORMAL HIGH (ref 65–99)

## 2016-10-20 LAB — LIPASE, BLOOD: LIPASE: 21 U/L (ref 11–51)

## 2016-10-20 MED ORDER — FUROSEMIDE 10 MG/ML IJ SOLN
20.0000 mg | Freq: Once | INTRAMUSCULAR | Status: AC
  Administered 2016-10-20: 20 mg via INTRAVENOUS
  Filled 2016-10-20: qty 2

## 2016-10-20 MED ORDER — POTASSIUM CHLORIDE CRYS ER 20 MEQ PO TBCR
40.0000 meq | EXTENDED_RELEASE_TABLET | Freq: Once | ORAL | Status: AC
  Administered 2016-10-20: 40 meq via ORAL
  Filled 2016-10-20: qty 2

## 2016-10-20 NOTE — Progress Notes (Signed)
[facility]  PROGRESS NOTE  Melissa Beasley AOZ:[id number] DOB: Aug 29, 1981 DOA: 10/12/2016   PCP: Irena Reichmann, DO  Brief HPI:   37 y/o female presented  to [facility] on Friday w/ 1 day hx of abd pain. She has a PMHx of DM2, and apparently familial hypertriglyceridemia, although it is not known if this was known prior to her presentation. She was found to have pancreatitis with triglycerides of >5500, with a lipase of 450. RUQ showed a fatty liver, but no biliary obstruction. CT and subsequent study showed evolving pancreatic edema, but without necrosis or fluid collection. She was transferred to [facility] for additional therapy.She was initially in ICU, transferred to medical floor and [facility] assumed care on 10/15/16  Subjective   Patient denies chest pain or shortness of breath.   Assessment/Plan:   Acute pancreatitis secondary to familial hypertriglyceridemia  - secondary to familial hypertriglyceridemia, patient presented with triglycerides greater than 5500 and lipase of 450 - Lipase is now trending down and is WNL - TG's are also trending down, ~500 this AM  - CT abd confirms significant inflammation but no signs of pseudocyst  - pt has so far tolerated PO clear liquids well - has been off IV Insulin and fluids since 2/4 and so far doing well  - on 2/4 added Gimfibrozil 600 mg BID - advance diet to soft to see if pt tolerating  - repeat TG and lipase in Am along with BMP   Fever overnight 2/1 - WBC also up from 14 K to 17 K, also with tachycardia and thus meeting sepsis criteria  - CT chest worrisome for development of bilateral PNA, added vancomycin 2/4 to Zosyn which pt has been already on  - lactic acid is WNL which is reassuring and Procalcitonin level is trending down since one week ago  Familial hypertriglyceridemia - patient presented with significantly elevated triglycerides greater than 5500 - treatment as noted above   Sinus tachycardia - likely from acute  pancreatitis, no chest pain this AM - added Metoprolol as needed for HR > 110 - keep on tele for now   UTI, yeast - added Diflucan 2/4  Diabetes mellitus - Glucotrol on hold, added SSI for now   Left hand swelling - patient had left and swelling/pain and numbness due to IV infiltration - surgery saw on 10/13/2016 and did not think it was a compartment syndrome - keep hand elevated above heart, ice as needed.  Hypokalemia - low this AM, supplement and repeat BMP in AM  Morbid obesity - in pt with BMI > 36 and underlying DM, Body mass index is 38.72 kg/m.  DVT prophylaxis: Heparin  Code Status: Full code  Family Communication: Family present at bedside, mom and dad  Disposition Plan: Pending improvement in triglycerides  Consultants:  PCCM  Procedures:  None  Antibiotics:  Zosyn 1/28 -->  Vancomycin 2/3 -->  Diflucan 2/3 -->  Objective   Vitals:   10/19/16 1533 10/19/16 1600 10/19/16 2336 10/20/16 0405  BP: 99/81 104/76 (!) 129/109 126/85  Pulse:  (!) 116  (!) 109  Resp:  17 18 (!) 22  Temp: (!) 101.1 F (38.4 C) 99 F (37.2 C) 99.6 F (37.6 C) 98.8 F (37.1 C)  TempSrc: Oral Oral Oral Oral  SpO2: 94% 99% 98%   Weight:    108.2 kg (238 lb 8.6 oz)  Height:        Intake/Output Summary (Last 24 hours) at 10/20/16 1227 Last data filed at 10/20/16  0600  Gross per 24 hour  Intake             1520 ml  Output             1050 ml  Net              470 ml   Filed Weights   10/18/16 0637 10/19/16 0604 10/20/16 0405  Weight: 109.3 kg (240 lb 14.4 oz) 112.4 kg (247 lb 12.8 oz) 108.2 kg (238 lb 8.6 oz)     Physical Examination:  General exam: Appears calm and comfortable. Respiratory system: Clear to auscultation. Respiratory effort normal. Cardiovascular system:  RRR. No  murmurs, rubs, gallops. No pedal edema. GI system: Abdomen is nondistended, soft and nontender. No organomegaly.  Central nervous system. No focal neurological deficits. 5 x 5  power in all extremities. Skin: No rashes, lesions or ulcers. Psychiatry: Alert, oriented x 3.Judgement and insight appear normal. Affect normal.  Data Reviewed: I have personally reviewed following labs and imaging studies  CBG:  Recent Labs Lab 10/19/16 1657 10/19/16 2030 10/19/16 2332 10/20/16 0744 10/20/16 1152  GLUCAP 176* 158* 156* 216* 186*    CBC:  Recent Labs Lab 10/17/16 0340 10/18/16 0529 10/19/16 0616 10/20/16 0540  WBC 14.0* 17.3* 14.2* 13.6*  HGB 9.4* 9.2* 8.1* 7.9*  HCT 29.9* 29.2* 26.0* 25.8*  MCV 90.1 89.8 89.3 89.9  PLT 338 421* 384 373    Basic Metabolic Panel:  Recent Labs Lab 10/14/16 0538 10/15/16 0541 10/17/16 0340 10/18/16 0529 10/19/16 0616 10/20/16 0540  NA 138 137 136 134* 136 136  K 3.3* 3.2* 3.0* 2.9* 3.3* 3.3*  CL 109 103 98* 94* 95* 98*  CO2 24 28 29 29 31 31   GLUCOSE 165* 144* 135* 159* 152* 215*  BUN 9 5* <5* <5* <5* <5*  CREATININE 0.62 0.59 0.51 0.67 0.64 0.60  CALCIUM 6.6* 7.2* 7.9* 7.9* 7.9* 7.9*  MG 2.3  --   --   --   --   --   PHOS 1.2* 2.1*  --   --   --   --     Recent Results (from the past 240 hour(s))  MRSA PCR Screening     Status: None   Collection Time: 10/12/16  5:22 PM  Result Value Ref Range Status   MRSA by PCR NEGATIVE NEGATIVE Final    Comment:        The GeneXpert MRSA Assay (FDA approved for NASAL specimens only), is one component of a comprehensive MRSA colonization surveillance program. It is not intended to diagnose MRSA infection nor to guide or monitor treatment for MRSA infections.   Urine culture     Status: Abnormal   Collection Time: 10/12/16  5:45 PM  Result Value Ref Range Status   Specimen Description URINE, RANDOM  Final   Special Requests NONE  Final   Culture <10,000 COLONIES/mL INSIGNIFICANT GROWTH (A)  Final   Report Status 10/14/2016 FINAL  Final  Culture, blood (routine x 2)     Status: None   Collection Time: 10/12/16  6:35 PM  Result Value Ref Range Status    Specimen Description BLOOD RIGHT ANTECUBITAL  Final   Special Requests BOTTLES DRAWN AEROBIC AND ANAEROBIC 5CC  Final   Culture NO GROWTH 5 DAYS  Final   Report Status 10/17/2016 FINAL  Final  Culture, blood (routine x 2)     Status: None   Collection Time: 10/12/16  6:43 PM  Result Value Ref  Range Status   Specimen Description BLOOD LEFT ANTECUBITAL  Final   Special Requests BOTTLES DRAWN AEROBIC AND ANAEROBIC 5CC  Final   Culture NO GROWTH 5 DAYS  Final   Report Status 10/17/2016 FINAL  Final  Culture, blood (x 2)     Status: None (Preliminary result)   Collection Time: 10/18/16  4:55 PM  Result Value Ref Range Status   Specimen Description BLOOD LEFT ARM  Final   Special Requests BOTTLES DRAWN AEROBIC ONLY 5CC  Final   Culture NO GROWTH < 24 HOURS  Final   Report Status PENDING  Incomplete  Culture, blood (x 2)     Status: None (Preliminary result)   Collection Time: 10/18/16  4:59 PM  Result Value Ref Range Status   Specimen Description BLOOD RIGHT ARM  Final   Special Requests IN PEDIATRIC BOTTLE 3CC  Final   Culture NO GROWTH < 24 HOURS  Final   Report Status PENDING  Incomplete  Culture, Urine     Status: Abnormal   Collection Time: 10/18/16  5:45 PM  Result Value Ref Range Status   Specimen Description URINE, RANDOM  Final   Special Requests NONE  Final   Culture 60,000 COLONIES/mL YEAST (A)  Final   Report Status 10/19/2016 FINAL  Final     Liver Function Tests:  Recent Labs Lab 10/17/16 0340  AST 23  ALT 18  ALKPHOS 60  BILITOT 1.0  PROT 5.6*  ALBUMIN 1.6*    Recent Labs Lab 10/16/16 0029 10/18/16 0529 10/20/16 0540  LIPASE 22 20 21   AMYLASE  --  40  --    Cardiac Enzymes: No results for input(s): CKTOTAL, CKMB, CKMBINDEX, TROPONINI in the last 168 hours. BNP (last 3 results)  Recent Labs  10/12/16 1833  BNP 190.0*    Studies: Ct Chest W Contrast  Result Date: 10/18/2016 CLINICAL DATA:  Concern for sepsis in patient with pancreatitis.  Further evaluation requested. Initial encounter. EXAM: CT CHEST, ABDOMEN, AND PELVIS WITH CONTRAST TECHNIQUE: Multidetector CT imaging of the chest, abdomen and pelvis was performed following the standard protocol during bolus administration of intravenous contrast. CONTRAST:  100 mL ISOVUE-300 IOPAMIDOL (ISOVUE-300) INJECTION 61% COMPARISON:  CT of the abdomen and pelvis performed 10/11/2016, and chest radiograph performed 10/13/2016 FINDINGS: CT CHEST FINDINGS Cardiovascular: The heart is normal in size. Scattered coronary artery calcifications are seen. A right IJ line is noted ending about the distal SVC. The thoracic aorta is grossly unremarkable. The great vessels are grossly unremarkable. Mediastinum/Nodes: The mediastinum is grossly unremarkable in appearance. No mediastinal lymphadenopathy is seen. No pericardial effusion is identified. The visualized portions of the thyroid gland are unremarkable. No axillary lymphadenopathy is seen. A small nodule at the right breast is grossly stable from 2008 and likely reflects normal fibroglandular tissue. Lungs/Pleura: Small left and trace right pleural effusions are noted. Bibasilar airspace opacities may reflect atelectasis or possibly pneumonia. No pneumothorax is seen. No dominant mass is identified. Musculoskeletal: No acute osseous abnormalities are identified. The visualized musculature is unremarkable in appearance. CT ABDOMEN PELVIS FINDINGS Hepatobiliary: There is diffuse fatty infiltration within the liver. The gallbladder is partially filled with contrast. The common bile duct is not well characterized. Pancreas: There is diffuse soft tissue inflammation about the pancreas, with associated fluid tracking about the stomach, small bowel loops, liver, spleen, and along both paracolic gutters into the pelvis. This is compatible with relatively severe acute pancreatitis. No well defined pseudocyst is seen. There is no definite evidence  of devascularization at  this time. Spleen: The spleen is grossly unremarkable in appearance. Adrenals/Urinary Tract: The adrenal glands are unremarkable in appearance. The kidneys are within normal limits. There is no evidence of hydronephrosis. No renal or ureteral stones are identified. Nonspecific perinephric stranding is noted bilaterally. Stomach/Bowel: The stomach is unremarkable in appearance. The small bowel is within normal limits. The appendix is normal in caliber, without evidence of appendicitis. The colon is unremarkable in appearance. Vascular/Lymphatic: Minimal calcification is seen along the abdominal aorta. No retroperitoneal or pelvic sidewall lymphadenopathy is seen. Reproductive: The bladder is mildly distended and grossly unremarkable. The uterus is grossly unremarkable. The ovaries are relatively symmetric. No suspicious adnexal masses are seen. Other: Diffuse soft tissue edema is seen along the abdominal and pelvic wall, likely reflecting anasarca. A small anterior abdominal wall hernia is seen just superior to the umbilicus, containing only fat. Musculoskeletal: No acute osseous abnormalities are identified. Vacuum phenomenon is noted at L5-S1. The visualized musculature is unremarkable in appearance. IMPRESSION: 1. Acute pancreatitis, with diffuse soft tissue inflammation about the pancreas, and small to moderate volume ascites within the abdomen and pelvis. No well defined pseudocyst seen. No definite evidence of devascularization at this time. 2. Small left and trace right pleural effusions. Bibasilar airspace opacities may reflect atelectasis or possibly pneumonia. 3. Diffuse soft tissue edema along the abdominal and pelvic wall, likely reflecting anasarca. 4. Scattered coronary artery calcifications seen. 5. Diffuse fatty infiltration within the liver. 6. Small anterior abdominal wall hernia just superior to the umbilicus, containing only fat. Electronically Signed   By: Roanna Raider M.D.   On: 10/18/2016  22:29   Ct Abdomen Pelvis W Contrast  Result Date: 10/18/2016 CLINICAL DATA:  Concern for sepsis in patient with pancreatitis. Further evaluation requested. Initial encounter. EXAM: CT CHEST, ABDOMEN, AND PELVIS WITH CONTRAST TECHNIQUE: Multidetector CT imaging of the chest, abdomen and pelvis was performed following the standard protocol during bolus administration of intravenous contrast. CONTRAST:  100 mL ISOVUE-300 IOPAMIDOL (ISOVUE-300) INJECTION 61% COMPARISON:  CT of the abdomen and pelvis performed 10/11/2016, and chest radiograph performed 10/13/2016 FINDINGS: CT CHEST FINDINGS Cardiovascular: The heart is normal in size. Scattered coronary artery calcifications are seen. A right IJ line is noted ending about the distal SVC. The thoracic aorta is grossly unremarkable. The great vessels are grossly unremarkable. Mediastinum/Nodes: The mediastinum is grossly unremarkable in appearance. No mediastinal lymphadenopathy is seen. No pericardial effusion is identified. The visualized portions of the thyroid gland are unremarkable. No axillary lymphadenopathy is seen. A small nodule at the right breast is grossly stable from 2008 and likely reflects normal fibroglandular tissue. Lungs/Pleura: Small left and trace right pleural effusions are noted. Bibasilar airspace opacities may reflect atelectasis or possibly pneumonia. No pneumothorax is seen. No dominant mass is identified. Musculoskeletal: No acute osseous abnormalities are identified. The visualized musculature is unremarkable in appearance. CT ABDOMEN PELVIS FINDINGS Hepatobiliary: There is diffuse fatty infiltration within the liver. The gallbladder is partially filled with contrast. The common bile duct is not well characterized. Pancreas: There is diffuse soft tissue inflammation about the pancreas, with associated fluid tracking about the stomach, small bowel loops, liver, spleen, and along both paracolic gutters into the pelvis. This is compatible  with relatively severe acute pancreatitis. No well defined pseudocyst is seen. There is no definite evidence of devascularization at this time. Spleen: The spleen is grossly unremarkable in appearance. Adrenals/Urinary Tract: The adrenal glands are unremarkable in appearance. The kidneys are within normal limits. There  is no evidence of hydronephrosis. No renal or ureteral stones are identified. Nonspecific perinephric stranding is noted bilaterally. Stomach/Bowel: The stomach is unremarkable in appearance. The small bowel is within normal limits. The appendix is normal in caliber, without evidence of appendicitis. The colon is unremarkable in appearance. Vascular/Lymphatic: Minimal calcification is seen along the abdominal aorta. No retroperitoneal or pelvic sidewall lymphadenopathy is seen. Reproductive: The bladder is mildly distended and grossly unremarkable. The uterus is grossly unremarkable. The ovaries are relatively symmetric. No suspicious adnexal masses are seen. Other: Diffuse soft tissue edema is seen along the abdominal and pelvic wall, likely reflecting anasarca. A small anterior abdominal wall hernia is seen just superior to the umbilicus, containing only fat. Musculoskeletal: No acute osseous abnormalities are identified. Vacuum phenomenon is noted at L5-S1. The visualized musculature is unremarkable in appearance. IMPRESSION: 1. Acute pancreatitis, with diffuse soft tissue inflammation about the pancreas, and small to moderate volume ascites within the abdomen and pelvis. No well defined pseudocyst seen. No definite evidence of devascularization at this time. 2. Small left and trace right pleural effusions. Bibasilar airspace opacities may reflect atelectasis or possibly pneumonia. 3. Diffuse soft tissue edema along the abdominal and pelvic wall, likely reflecting anasarca. 4. Scattered coronary artery calcifications seen. 5. Diffuse fatty infiltration within the liver. 6. Small anterior abdominal  wall hernia just superior to the umbilicus, containing only fat. Electronically Signed   By: Roanna Raider M.D.   On: 10/18/2016 22:29    Scheduled Meds: . chlorhexidine  15 mL Mouth Rinse BID  . fluconazole  100 mg Oral Daily  . gemfibrozil  600 mg Oral BID AC  . heparin  5,000 Units Subcutaneous Q8H  . insulin aspart  0-9 Units Subcutaneous TID WC  . lidocaine  1 patch Transdermal Q24H  . mouth rinse  15 mL Mouth Rinse q12n4p  . piperacillin-tazobactam (ZOSYN)  IV  3.375 g Intravenous Q8H  . sodium chloride flush  10-40 mL Intracatheter Q12H  . vancomycin  1,000 mg Intravenous Q8H   Time spent: 25 min  Debbora Presto, MD   [facility] Pager [phone]. If 7PM-7AM, please contact night-coverage at [email or web address],  Office  [phone] Password [email or web address] 10/20/2016, 12:27 PM  LOS: 8 days

## 2016-10-20 NOTE — Progress Notes (Signed)
[facility]AR format, updated on day's events, new orders, current VS and labs, assumed care of patient.

## 2016-10-21 LAB — TRIGLYCERIDES: TRIGLYCERIDES: 483 mg/dL — AB (ref <150)

## 2016-10-21 LAB — CBC
HCT: 26.1 % — ABNORMAL LOW (ref 36.0–46.0)
Hemoglobin: 8.2 g/dL — ABNORMAL LOW (ref 12.0–15.0)
MCH: 28.4 pg (ref 26.0–34.0)
MCHC: 31.4 g/dL (ref 30.0–36.0)
MCV: 90.3 fL (ref 78.0–100.0)
PLATELETS: 365 10*3/uL (ref 150–400)
RBC: 2.89 MIL/uL — AB (ref 3.87–5.11)
RDW: 14.3 % (ref 11.5–15.5)
WBC: 11.4 10*3/uL — ABNORMAL HIGH (ref 4.0–10.5)

## 2016-10-21 LAB — GLUCOSE, CAPILLARY
GLUCOSE-CAPILLARY: 226 mg/dL — AB (ref 65–99)
GLUCOSE-CAPILLARY: 241 mg/dL — AB (ref 65–99)
Glucose-Capillary: 192 mg/dL — ABNORMAL HIGH (ref 65–99)
Glucose-Capillary: 233 mg/dL — ABNORMAL HIGH (ref 65–99)

## 2016-10-21 LAB — COMPREHENSIVE METABOLIC PANEL
ALBUMIN: 1.6 g/dL — AB (ref 3.5–5.0)
ALK PHOS: 56 U/L (ref 38–126)
ALT: 19 U/L (ref 14–54)
ANION GAP: 10 (ref 5–15)
AST: 23 U/L (ref 15–41)
BILIRUBIN TOTAL: 0.7 mg/dL (ref 0.3–1.2)
BUN: <5 mg/dL — ABNORMAL LOW (ref 6–20)
CO2: 32 mmol/L (ref 22–32)
Calcium: 8 mg/dL — ABNORMAL LOW (ref 8.9–10.3)
Chloride: 94 mmol/L — ABNORMAL LOW (ref 101–111)
Creatinine, Ser: 0.56 mg/dL (ref 0.44–1.00)
GFR calc Af Amer: >60 mL/min (ref >60)
GFR calc non Af Amer: >60 mL/min (ref >60)
GLUCOSE: 219 mg/dL — AB (ref 65–99)
POTASSIUM: 3.3 mmol/L — AB (ref 3.5–5.1)
Sodium: 136 mmol/L (ref 135–145)
Total Protein: 5.7 g/dL — ABNORMAL LOW (ref 6.5–8.1)

## 2016-10-21 LAB — LEGIONELLA PNEUMOPHILA SEROGP 1 UR AG: L. PNEUMOPHILA SEROGP 1 UR AG: NEGATIVE

## 2016-10-21 LAB — PHOSPHORUS: Phosphorus: 3.6 mg/dL (ref 2.5–4.6)

## 2016-10-21 LAB — MAGNESIUM: Magnesium: 1.8 mg/dL (ref 1.7–2.4)

## 2016-10-21 MED ORDER — INSULIN GLARGINE 100 UNIT/ML ~~LOC~~ SOLN
15.0000 [IU] | Freq: Every day | SUBCUTANEOUS | Status: DC
  Administered 2016-10-21 – 2016-10-23 (×3): 15 [IU] via SUBCUTANEOUS
  Filled 2016-10-21 (×3): qty 0.15

## 2016-10-21 NOTE — Progress Notes (Signed)
Inpatient Diabetes Program Recommendations  AACE/ADA: New Consensus Statement on Inpatient Glycemic Control (2015)  Target Ranges:  Prepandial:   less than 140 mg/dL      Peak postprandial:   less than 180 mg/dL (1-2 hours)      Critically ill patients:  140 - 180 mg/dL   Lab Results  Component Value Date   GLUCAP 226 (H) 10/21/2016    Review of Glycemic Control  Diabetes history: DM2 Outpatient Diabetes medications: glipizide 2.5 mg Q24H Current orders for Inpatient glycemic control: Novolog 0-9 units tidwc  No HgbA1c available.  Inpatient Diabetes Program Recommendations:     Add Lantus 15 units QHS.  Change diet to CHO mod med.  Thank you. Melissa Beasley, RD, LDN, CDE Inpatient Diabetes Coordinator [phone]

## 2016-10-21 NOTE — Progress Notes (Signed)
Physical Therapy Treatment Patient Details Name: Melissa Beasley MRN: [id number] DOB: 02-01-1981 Today's Date: 10/21/2016    History of Present Illness Patient is a 36 y/o female with hx of pancreatitis, DM presents with abdominal pain. Found to have Acute pancreatitis secondary to familial hypertriglyceridemia. Triglycerides greater than 5500 and lipase of 45    PT Comments    Patient reporting abdomen bloating and soreness today. Reports having had a BM. Agreeable to ambulation in hallway. Continues to need to grab onto rail in hall intermittently for support. HR up to 138 bpm during activity. Encouraged ambulation while in hospital. Will follow for stair training next session as tolerated.   Follow Up Recommendations  No PT follow up;Supervision - Intermittent     Equipment Recommendations  None recommended by PT    Recommendations for Other Services       Precautions / Restrictions Precautions Precautions: Fall Restrictions Weight Bearing Restrictions: No    Mobility  Bed Mobility Overal bed mobility: Modified Independent                Transfers Overall transfer level: Modified independent               General transfer comment: Stood from EOB without difficulty. Stood from toilet x1.   Ambulation/Gait Ambulation/Gait assistance: Supervision Ambulation Distance (Feet): 150 Feet Assistive device: None Gait Pattern/deviations: Step-through pattern;Decreased stride length Gait velocity: decreased Gait velocity interpretation: Below normal speed for age/gender General Gait Details: Slow, guarded gait reaching for rail intermittently for support. HR up to 138 bpm.   Stairs            Wheelchair Mobility    Modified Rankin (Stroke Patients Only)       Balance Overall balance assessment: Needs assistance Sitting-balance support: Feet supported;No upper extremity supported Sitting balance-Leahy Scale: Good Sitting balance - Comments: Assist to  donn socks due to bloated abdomen.   Standing balance support: During functional activity Standing balance-Leahy Scale: Good Standing balance comment: Able to wash hands at sink wtihout difficulty.                     Cognition Arousal/Alertness: Awake/alert Behavior During Therapy: WFL for tasks assessed/performed Overall Cognitive Status: Within Functional Limits for tasks assessed                      Exercises      General Comments        Pertinent Vitals/Pain Pain Assessment: Faces Faces Pain Scale: Hurts little more Pain Location: abdomen Pain Descriptors / Indicators: Sore;Aching Pain Intervention(s): Monitored during session;Repositioned    Home Living                      Prior Function            PT Goals (current goals can now be found in the care plan section) Progress towards PT goals: Progressing toward goals    Frequency    Min 3X/week      PT Plan Current plan remains appropriate    Co-evaluation             End of Session Equipment Utilized During Treatment: Gait belt Activity Tolerance: Patient tolerated treatment well Patient left: in bed;with call bell/phone within reach     Time: 1543-1600 PT Time Calculation (min) (ACUTE ONLY): 17 min  Charges:  $Therapeutic Exercise: 8-22 mins  G Codes:      Eyla Tallon A Kedron Uno 10/21/2016, 4:03 PM  Mylo RedShauna Shantee Hayne, PT, DPT [phone]

## 2016-10-21 NOTE — Progress Notes (Signed)
[facility]  PROGRESS NOTE  Melissa Beasley:[id number] DOB: [birth year] DOA: 10/12/2016   PCP: Irena Reichmann, DO  Brief HPI:   36 y/o female presented  to [place] on Friday w/ 1 day hx of abd pain. She has a PMHx of DM2, and apparently familial hypertriglyceridemia, although it is not known if this was known prior to her presentation. She was found to have pancreatitis with triglycerides of >5500, with a lipase of 450. RUQ showed a fatty liver, but no biliary obstruction. CT and subsequent study showed evolving pancreatic edema, but without necrosis or fluid collection. She was transferred to [facility] for additional therapy.She was initially in ICU, transferred to medical floor and [facility] assumed care on 10/15/16  Subjective   Patient denies chest pain or shortness of breath.   Assessment/Plan:   Acute pancreatitis secondary to familial hypertriglyceridemia  - secondary to familial hypertriglyceridemia, patient presented with triglycerides greater than 5500 and lipase of 450 - Lipase is now trending down and is WNL - TG's are also trending down, 483 this AM - CT abd confirms significant inflammation but no signs of pseudocyst  - pt has so far tolerated PO clear liquids well - has been off IV Insulin and fluids since 2/4 and so far doing well  - on 2/4 added Gimfibrozil 600 mg BID - advance diet to soft to see if pt tolerating  - repeat TG and lipase in Am along with BMP   Fever overnight 2/1 - WBC also up from 14 K to 17 K, also with tachycardia and thus meeting sepsis criteria  - CT chest worrisome for development of bilateral PNA, added vancomycin 2/4 to Zosyn which pt has been already on, continue same ABX regimen for now until fever and leukocytosis resolved  - lactic acid is WNL which is reassuring and Procalcitonin level is trending down since one week ago  Familial hypertriglyceridemia - patient presented with significantly elevated triglycerides greater than 5500 -  treatment as noted above   Sinus tachycardia - likely from acute pancreatitis, no chest pain this AM - added Metoprolol as needed for HR > 110 - keep on tele for now   UTI, yeast - added Diflucan 2/4, continue same regimen  Diabetes mellitus - Glucotrol on hold, added SSI for now  - added Lantus 15 U QHS  Left hand swelling - patient had left and swelling/pain and numbness due to IV infiltration - surgery saw on 10/13/2016 and did not think it was a compartment syndrome - keep hand elevated above heart, ice as needed.  Hypokalemia - low this AM, supplement and repeat BMP in AM  Morbid obesity - in pt with BMI > 36 and underlying DM, Body mass index is 38.72 kg/m.  DVT prophylaxis: Heparin  Code Status: Full code  Family Communication: Family present at bedside, mom and dad  Disposition Plan: Pending improvement in triglycerides  Consultants:  PCCM  Procedures:  None  Antibiotics:  Zosyn 1/28 -->  Vancomycin 2/3 -->  Diflucan 2/3 -->  Objective   Vitals:   10/20/16 1519 10/20/16 2143 10/21/16 0035 10/21/16 0628  BP: 113/80 112/79 115/83 113/73  Pulse: (!) 116 (!) 109 (!) 102   Resp: 19 16 16 15   Temp: 100.3 F (37.9 C) 98.6 F (37 C) 98.7 F (37.1 C) 99.1 F (37.3 C)  TempSrc:  Oral Oral Oral  SpO2: 96% 96% 96% 95%  Weight:    108.7 kg (239 lb 9.6 oz)  Height:  Intake/Output Summary (Last 24 hours) at 10/21/16 1220 Last data filed at 10/21/16 1054  Gross per 24 hour  Intake             1530 ml  Output             1000 ml  Net              530 ml   Filed Weights   10/19/16 0604 10/20/16 0405 10/21/16 0628  Weight: 112.4 kg (247 lb 12.8 oz) 108.2 kg (238 lb 8.6 oz) 108.7 kg (239 lb 9.6 oz)     Physical Examination:  General exam: Appears calm and comfortable. Respiratory system: Clear to auscultation. Respiratory effort normal. Cardiovascular system:  RRR. No  murmurs, rubs, gallops. No pedal edema. GI system: Abdomen is  nondistended, soft and nontender. No organomegaly.  Central nervous system. No focal neurological deficits. 5 x 5 power in all extremities. Skin: No rashes, lesions or ulcers. Psychiatry: Alert, oriented x 3.Judgement and insight appear normal. Affect normal.  Data Reviewed: I have personally reviewed following labs and imaging studies  CBG:  Recent Labs Lab 10/20/16 1152 10/20/16 1717 10/20/16 2141 10/21/16 0758 10/21/16 1133  GLUCAP 186* 188* 257* 226* 192*    CBC:  Recent Labs Lab 10/17/16 0340 10/18/16 0529 10/19/16 0616 10/20/16 0540 10/21/16 0500  WBC 14.0* 17.3* 14.2* 13.6* 11.4*  HGB 9.4* 9.2* 8.1* 7.9* 8.2*  HCT 29.9* 29.2* 26.0* 25.8* 26.1*  MCV 90.1 89.8 89.3 89.9 90.3  PLT 338 421* 384 373 365    Basic Metabolic Panel:  Recent Labs Lab 10/15/16 0541 10/17/16 0340 10/18/16 0529 10/19/16 0616 10/20/16 0540 10/21/16 0500  NA 137 136 134* 136 136 136  K 3.2* 3.0* 2.9* 3.3* 3.3* 3.3*  CL 103 98* 94* 95* 98* 94*  CO2 28 29 29 31 31  32  GLUCOSE 144* 135* 159* 152* 215* 219*  BUN 5* <5* <5* <5* <5* <5*  CREATININE 0.59 0.51 0.67 0.64 0.60 0.56  CALCIUM 7.2* 7.9* 7.9* 7.9* 7.9* 8.0*  MG  --   --   --   --   --  1.8  PHOS 2.1*  --   --   --   --  3.6    Recent Results (from the past 240 hour(s))  MRSA PCR Screening     Status: None   Collection Time: 10/12/16  5:22 PM  Result Value Ref Range Status   MRSA by PCR NEGATIVE NEGATIVE Final    Comment:        The GeneXpert MRSA Assay (FDA approved for NASAL specimens only), is one component of a comprehensive MRSA colonization surveillance program. It is not intended to diagnose MRSA infection nor to guide or monitor treatment for MRSA infections.   Urine culture     Status: Abnormal   Collection Time: 10/12/16  5:45 PM  Result Value Ref Range Status   Specimen Description URINE, RANDOM  Final   Special Requests NONE  Final   Culture <10,000 COLONIES/mL INSIGNIFICANT GROWTH (A)  Final    Report Status 10/14/2016 FINAL  Final  Culture, blood (routine x 2)     Status: None   Collection Time: 10/12/16  6:35 PM  Result Value Ref Range Status   Specimen Description BLOOD RIGHT ANTECUBITAL  Final   Special Requests BOTTLES DRAWN AEROBIC AND ANAEROBIC 5CC  Final   Culture NO GROWTH 5 DAYS  Final   Report Status 10/17/2016 FINAL  Final  Culture, blood (routine  x 2)     Status: None   Collection Time: 10/12/16  6:43 PM  Result Value Ref Range Status   Specimen Description BLOOD LEFT ANTECUBITAL  Final   Special Requests BOTTLES DRAWN AEROBIC AND ANAEROBIC 5CC  Final   Culture NO GROWTH 5 DAYS  Final   Report Status 10/17/2016 FINAL  Final  Culture, blood (x 2)     Status: None (Preliminary result)   Collection Time: 10/18/16  4:55 PM  Result Value Ref Range Status   Specimen Description BLOOD LEFT ARM  Final   Special Requests BOTTLES DRAWN AEROBIC ONLY 5CC  Final   Culture NO GROWTH 2 DAYS  Final   Report Status PENDING  Incomplete  Culture, blood (x 2)     Status: None (Preliminary result)   Collection Time: 10/18/16  4:59 PM  Result Value Ref Range Status   Specimen Description BLOOD RIGHT ARM  Final   Special Requests IN PEDIATRIC BOTTLE 3CC  Final   Culture NO GROWTH 2 DAYS  Final   Report Status PENDING  Incomplete  Culture, Urine     Status: Abnormal   Collection Time: 10/18/16  5:45 PM  Result Value Ref Range Status   Specimen Description URINE, RANDOM  Final   Special Requests NONE  Final   Culture 60,000 COLONIES/mL YEAST (A)  Final   Report Status 10/19/2016 FINAL  Final     Liver Function Tests:  Recent Labs Lab 10/17/16 0340 10/21/16 0500  AST 23 23  ALT 18 19  ALKPHOS 60 56  BILITOT 1.0 0.7  PROT 5.6* 5.7*  ALBUMIN 1.6* 1.6*    Recent Labs Lab 10/16/16 0029 10/18/16 0529 10/20/16 0540  LIPASE 22 20 21   AMYLASE  --  40  --    Cardiac Enzymes: No results for input(s): CKTOTAL, CKMB, CKMBINDEX, TROPONINI in the last 168 hours. BNP (last  3 results)  Recent Labs  10/12/16 1833  BNP 190.0*    Studies: No results found.  Scheduled Meds: . chlorhexidine  15 mL Mouth Rinse BID  . fluconazole  100 mg Oral Daily  . gemfibrozil  600 mg Oral BID AC  . heparin  5,000 Units Subcutaneous Q8H  . insulin aspart  0-9 Units Subcutaneous TID WC  . insulin glargine  15 Units Subcutaneous QHS  . lidocaine  1 patch Transdermal Q24H  . mouth rinse  15 mL Mouth Rinse q12n4p  . piperacillin-tazobactam (ZOSYN)  IV  3.375 g Intravenous Q8H  . sodium chloride flush  10-40 mL Intracatheter Q12H  . vancomycin  1,000 mg Intravenous Q8H   Time spent: 25 min  Debbora PrestoMAGICK-Lorene Klimas, MD   [facility] Pager [phone]. If 7PM-7AM, please contact night-coverage at [email or web address],  Office  [phone] Password [email or web address] 10/21/2016, 12:20 PM  LOS: 9 days

## 2016-10-22 LAB — CBC
HCT: 28.2 % — ABNORMAL LOW (ref 36.0–46.0)
Hemoglobin: 8.6 g/dL — ABNORMAL LOW (ref 12.0–15.0)
MCH: 27.3 pg (ref 26.0–34.0)
MCHC: 30.5 g/dL (ref 30.0–36.0)
MCV: 89.5 fL (ref 78.0–100.0)
Platelets: 361 10*3/uL (ref 150–400)
RBC: 3.15 MIL/uL — ABNORMAL LOW (ref 3.87–5.11)
RDW: 14 % (ref 11.5–15.5)
WBC: 9 10*3/uL (ref 4.0–10.5)

## 2016-10-22 LAB — BASIC METABOLIC PANEL
Anion gap: 11 (ref 5–15)
BUN: <5 mg/dL — ABNORMAL LOW (ref 6–20)
CALCIUM: 8.2 mg/dL — AB (ref 8.9–10.3)
CO2: 30 mmol/L (ref 22–32)
CREATININE: 0.7 mg/dL (ref 0.44–1.00)
Chloride: 97 mmol/L — ABNORMAL LOW (ref 101–111)
GFR calc non Af Amer: >60 mL/min (ref >60)
Glucose, Bld: 216 mg/dL — ABNORMAL HIGH (ref 65–99)
Potassium: 3.5 mmol/L (ref 3.5–5.1)
SODIUM: 138 mmol/L (ref 135–145)

## 2016-10-22 LAB — GLUCOSE, CAPILLARY
Glucose-Capillary: 204 mg/dL — ABNORMAL HIGH (ref 65–99)
Glucose-Capillary: 222 mg/dL — ABNORMAL HIGH (ref 65–99)
Glucose-Capillary: 166 mg/dL — ABNORMAL HIGH (ref 65–99)
Glucose-Capillary: 195 mg/dL — ABNORMAL HIGH (ref 65–99)

## 2016-10-22 LAB — TRIGLYCERIDES: TRIGLYCERIDES: 427 mg/dL — AB (ref <150)

## 2016-10-22 NOTE — Progress Notes (Signed)
Updated report received via Inetta Fermoina RN using SBAR format, reviewed new orders and events of the day, assumed care of patient

## 2016-10-22 NOTE — Care Management Note (Addendum)
Case Management Note  Patient Details  Name: Melissa Beasley MRN: [id number] Date of Birth: 1980/11/20  Subjective/Objective:  Pt presented for abdominal pain. Pt with increased triglycerides of >5500. [facility]. Pt continues on prn dilaudid for pain management and IV antibiotics for fever and CT worrisome for Bilateral PNA. Plan will be to return home once stable.                  Action/Plan: CM will continue to monitor for additional needs.   Expected Discharge Date:                  Expected Discharge Plan:  Home/Self Care  In-House Referral:  NA  Discharge planning Services  CM Consult  Post Acute Care Choice:  NA Choice offered to:  NA  DME Arranged:  N/A DME Agency:  NA  HH Arranged:  NA HH Agency:  NA  Status of Service:  Completed, signed off  If discussed at Long Length of Stay Meetings, dates discussed:    Additional Comments: 1044 10-24-16 Plan will be for patient to d/c home 10-24-16. No needs identified.  Gala LewandowskyGraves-Bigelow, Jiro Kiester Kaye, RN 10/22/2016, 3:50 PM

## 2016-10-22 NOTE — Progress Notes (Signed)
[facility]  PROGRESS NOTE  Melissa Beasley ZOX:[id number] DOB: [birth year] DOA: 10/12/2016   PCP: Irena Reichmann, DO  Brief HPI:   36 y/o female presented  to [place] on Friday w/ 1 day hx of abd pain. She has a PMHx of DM2, and apparently familial hypertriglyceridemia, although it is not known if this was known prior to her presentation. She was found to have pancreatitis with triglycerides of >5500, with a lipase of 450. RUQ showed a fatty liver, but no biliary obstruction. CT and subsequent study showed evolving pancreatic edema, but without necrosis or fluid collection. She was transferred to [facility] for additional therapy.She was initially in ICU, transferred to medical floor and [facility] assumed care on 10/15/16  Subjective   Patient denies chest pain or shortness of breath.   Assessment/Plan:   Acute pancreatitis secondary to familial hypertriglyceridemia  - secondary to familial hypertriglyceridemia, patient presented with triglycerides greater than 5500 and lipase of 450 - Lipase is now trending down and is WNL - TG's are also trending down, 427 this AM - CT abd confirms significant inflammation but no signs of pseudocyst  - pt has so far tolerated regular diet  - has been off IV Insulin and fluids since 2/4 and so far doing well  - on 2/4 added Gimfibrozil 600 mg BID - repeat TG and lipase in Am along with BMP   Fever overnight 2/1 - WBC also up from 14 K to 17 K, also with tachycardia and thus meeting sepsis criteria  - CT chest worrisome for development of bilateral PNA, added vancomycin 2/4 to Zosyn which pt has been already on, continue same ABX regimen for now until fever resolved, WBC is now finally down to normal limits but pt still with Tmax 100 F - fever curve overall trending down  - lactic acid is WNL which is reassuring and Procalcitonin level is trending down since one week ago  Familial hypertriglyceridemia - patient presented with significantly elevated  triglycerides greater than 5500 - treatment as noted above   Sinus tachycardia - likely from acute pancreatitis, no chest pain this AM - added Metoprolol as needed for HR > 110 - no chest pain, likely d/c tele if no events on tele in next 24 hours   UTI, yeast - added Diflucan 2/4, continue same regimen  Diabetes mellitus - Glucotrol on hold, added SSI for now  - added Lantus 15 U QHS  Left hand swelling - patient had left and swelling/pain and numbness due to IV infiltration - surgery saw on 10/13/2016 and did not think it was a compartment syndrome - keep hand elevated above heart, ice as needed.  Hypokalemia - on low end of normal, will add one more dose of K-dur   Morbid obesity - in pt with BMI > 36 and underlying DM, Body mass index is 38.72 kg/m.  DVT prophylaxis: Heparin  Code Status: Full code  Family Communication: Family present at bedside, mom and dad  Disposition Plan: Pending improvement in triglycerides, resolution of fevers   Consultants:  PCCM  Procedures:  None  Antibiotics:  Zosyn 1/28 -->  Vancomycin 2/3 -->  Diflucan 2/3 -->  Objective   Vitals:   10/21/16 0628 10/21/16 1514 10/21/16 2134 10/22/16 0500  BP: 113/73 115/75 131/79 111/76  Pulse:  (!) 101 (!) 117 (!) 102  Resp: 15 16 20 16   Temp: 99.1 F (37.3 C) 98.9 F (37.2 C) 97.9 F (36.6 C) 100 F (37.8 C)  TempSrc: Oral  Oral Oral Oral  SpO2: 95% 97% 99% 100%  Weight: 108.7 kg (239 lb 9.6 oz)   107.1 kg (236 lb 1.6 oz)  Height:        Intake/Output Summary (Last 24 hours) at 10/22/16 1034 Last data filed at 10/22/16 0445  Gross per 24 hour  Intake             2200 ml  Output             3050 ml  Net             -850 ml   Filed Weights   10/20/16 0405 10/21/16 0628 10/22/16 0500  Weight: 108.2 kg (238 lb 8.6 oz) 108.7 kg (239 lb 9.6 oz) 107.1 kg (236 lb 1.6 oz)     Physical Examination:  General exam: Appears calm and comfortable. Respiratory system: Clear to  auscultation. Respiratory effort normal. Cardiovascular system:  RRR. No  murmurs, rubs, gallops. No pedal edema. GI system: Abdomen is nondistended, soft and nontender. No organomegaly.  Central nervous system. No focal neurological deficits. 5 x 5 power in all extremities. Skin: No rashes, lesions or ulcers. Psychiatry: Alert, oriented x 3.Judgement and insight appear normal. Affect normal.  Data Reviewed: I have personally reviewed following labs and imaging studies  CBG:  Recent Labs Lab 10/21/16 0758 10/21/16 1133 10/21/16 1637 10/21/16 2115 10/22/16 0739  GLUCAP 226* 192* 241* 233* 204*    CBC:  Recent Labs Lab 10/18/16 0529 10/19/16 0616 10/20/16 0540 10/21/16 0500 10/22/16 0500  WBC 17.3* 14.2* 13.6* 11.4* 9.0  HGB 9.2* 8.1* 7.9* 8.2* 8.6*  HCT 29.2* 26.0* 25.8* 26.1* 28.2*  MCV 89.8 89.3 89.9 90.3 89.5  PLT 421* 384 373 365 361    Basic Metabolic Panel:  Recent Labs Lab 10/18/16 0529 10/19/16 0616 10/20/16 0540 10/21/16 0500 10/22/16 0500  NA 134* 136 136 136 138  K 2.9* 3.3* 3.3* 3.3* 3.5  CL 94* 95* 98* 94* 97*  CO2 29 31 31  32 30  GLUCOSE 159* 152* 215* 219* 216*  BUN <5* <5* <5* <5* <5*  CREATININE 0.67 0.64 0.60 0.56 0.70  CALCIUM 7.9* 7.9* 7.9* 8.0* 8.2*  MG  --   --   --  1.8  --   PHOS  --   --   --  3.6  --     Recent Results (from the past 240 hour(s))  MRSA PCR Screening     Status: None   Collection Time: 10/12/16  5:22 PM  Result Value Ref Range Status   MRSA by PCR NEGATIVE NEGATIVE Final    Comment:        The GeneXpert MRSA Assay (FDA approved for NASAL specimens only), is one component of a comprehensive MRSA colonization surveillance program. It is not intended to diagnose MRSA infection nor to guide or monitor treatment for MRSA infections.   Urine culture     Status: Abnormal   Collection Time: 10/12/16  5:45 PM  Result Value Ref Range Status   Specimen Description URINE, RANDOM  Final   Special Requests NONE   Final   Culture <10,000 COLONIES/mL INSIGNIFICANT GROWTH (A)  Final   Report Status 10/14/2016 FINAL  Final  Culture, blood (routine x 2)     Status: None   Collection Time: 10/12/16  6:35 PM  Result Value Ref Range Status   Specimen Description BLOOD RIGHT ANTECUBITAL  Final   Special Requests BOTTLES DRAWN AEROBIC AND ANAEROBIC 5CC  Final  Culture NO GROWTH 5 DAYS  Final   Report Status 10/17/2016 FINAL  Final  Culture, blood (routine x 2)     Status: None   Collection Time: 10/12/16  6:43 PM  Result Value Ref Range Status   Specimen Description BLOOD LEFT ANTECUBITAL  Final   Special Requests BOTTLES DRAWN AEROBIC AND ANAEROBIC 5CC  Final   Culture NO GROWTH 5 DAYS  Final   Report Status 10/17/2016 FINAL  Final  Culture, blood (x 2)     Status: None (Preliminary result)   Collection Time: 10/18/16  4:55 PM  Result Value Ref Range Status   Specimen Description BLOOD LEFT ARM  Final   Special Requests BOTTLES DRAWN AEROBIC ONLY 5CC  Final   Culture NO GROWTH 3 DAYS  Final   Report Status PENDING  Incomplete  Culture, blood (x 2)     Status: None (Preliminary result)   Collection Time: 10/18/16  4:59 PM  Result Value Ref Range Status   Specimen Description BLOOD RIGHT ARM  Final   Special Requests IN PEDIATRIC BOTTLE 3CC  Final   Culture NO GROWTH 3 DAYS  Final   Report Status PENDING  Incomplete  Culture, Urine     Status: Abnormal   Collection Time: 10/18/16  5:45 PM  Result Value Ref Range Status   Specimen Description URINE, RANDOM  Final   Special Requests NONE  Final   Culture 60,000 COLONIES/mL YEAST (A)  Final   Report Status 10/19/2016 FINAL  Final     Liver Function Tests:  Recent Labs Lab 10/17/16 0340 10/21/16 0500  AST 23 23  ALT 18 19  ALKPHOS 60 56  BILITOT 1.0 0.7  PROT 5.6* 5.7*  ALBUMIN 1.6* 1.6*    Recent Labs Lab 10/16/16 0029 10/18/16 0529 10/20/16 0540  LIPASE 22 20 21   AMYLASE  --  40  --    BNP (last 3 results)  Recent Labs   10/12/16 1833  BNP 190.0*    Studies: No results found.  Scheduled Meds: . chlorhexidine  15 mL Mouth Rinse BID  . fluconazole  100 mg Oral Daily  . gemfibrozil  600 mg Oral BID AC  . heparin  5,000 Units Subcutaneous Q8H  . insulin aspart  0-9 Units Subcutaneous TID WC  . insulin glargine  15 Units Subcutaneous QHS  . lidocaine  1 patch Transdermal Q24H  . mouth rinse  15 mL Mouth Rinse q12n4p  . piperacillin-tazobactam (ZOSYN)  IV  3.375 g Intravenous Q8H  . sodium chloride flush  10-40 mL Intracatheter Q12H  . vancomycin  1,000 mg Intravenous Q8H   Time spent: 25 min  Debbora PrestoMAGICK-MYERS, ISKRA, MD   [facility] Pager [phone]. If 7PM-7AM, please contact night-coverage at [email or web address],  Office  [phone] Password [email or web address] 10/22/2016, 10:34 AM  LOS: 10 days

## 2016-10-23 ENCOUNTER — Inpatient Hospital Stay (HOSPITAL_COMMUNITY): Payer: BLUE CROSS/BLUE SHIELD

## 2016-10-23 LAB — CULTURE, BLOOD (ROUTINE X 2)
CULTURE: NO GROWTH
Culture: NO GROWTH

## 2016-10-23 LAB — GLUCOSE, CAPILLARY
GLUCOSE-CAPILLARY: 175 mg/dL — AB (ref 65–99)
GLUCOSE-CAPILLARY: 238 mg/dL — AB (ref 65–99)
GLUCOSE-CAPILLARY: 177 mg/dL — AB (ref 65–99)
Glucose-Capillary: 145 mg/dL — ABNORMAL HIGH (ref 65–99)

## 2016-10-23 LAB — TRIGLYCERIDES: TRIGLYCERIDES: 435 mg/dL — AB (ref <150)

## 2016-10-23 MED ORDER — FUROSEMIDE 10 MG/ML IJ SOLN
20.0000 mg | Freq: Once | INTRAMUSCULAR | Status: AC
  Administered 2016-10-23: 20 mg via INTRAVENOUS
  Filled 2016-10-23: qty 2

## 2016-10-23 MED ORDER — OXYCODONE-ACETAMINOPHEN 5-325 MG PO TABS
1.0000 | ORAL_TABLET | ORAL | Status: DC | PRN
  Administered 2016-10-23 – 2016-10-24 (×5): 2 via ORAL
  Filled 2016-10-23 (×5): qty 2

## 2016-10-23 MED ORDER — HYDROMORPHONE HCL 1 MG/ML IJ SOLN: 1.0000 mg | INTRAMUSCULAR | Status: DC | PRN

## 2016-10-23 NOTE — Progress Notes (Signed)
Spoke with Dr. Elvera LennoxGherghe concerning possibility of placing peripheral IV and removing central line, MD stated he would like to keep central line in for now.  Melissa Beasley, Melissa Beasley

## 2016-10-23 NOTE — Progress Notes (Signed)
PROGRESS NOTE  Melissa Beasley WUJ:[id number] DOB: 1981-02-01 DOA: 10/12/2016 PCP: Irena Reichmann, DO   LOS: 11 days   Brief Narrative: 36 y/o female presented  to [place] on Friday w/ 1 day hx of abd pain. She has a PMHx of DM2, and apparently familial hypertriglyceridemia, although it is not known if this was known prior to her presentation. She was found to have pancreatitis with triglycerides of >5500, with a lipase of 450. RUQ showed a fatty liver, but no biliary obstruction. CT and subsequent study showed evolving pancreatic edema, but without necrosis or fluid collection. She was transferred to [facility] for additional therapy.She was initially in ICU, transferred to medical floor and [facility] assumed care on 10/15/16  Assessment & Plan: Active Problems:   Pancreatitis   Ileus (HCC)   Type 2 diabetes mellitus with hyperglycemia (HCC)   Hypertriglyceridemia   Acute pancreatitis secondary to familial hypertriglyceridemia  - secondary to familial hypertriglyceridemia, patient presented with triglycerides greater than 5500 and lipase of 450 - Lipase is now trending down and is WNL - TG's are also trending down - CT abd confirms significant inflammation but no signs of pseudocyst  - pt has so far tolerated regular diet  - has been off IV Insulin and fluids since 2/4 and so far doing well  - on 2/4 added Gimfibrozil 600 mg BID - repeat TG and lipase in Am along with BMP   Sepsis due to aspiration pneumonia / fever 2/1 - WBC also up from 14 K to 17 K, also with tachycardia and thus meeting sepsis criteria  - CT chest worrisome for development of bilateral PNA, added vancomycin 2/4 to Zosyn which pt has been already on, continue same ABX regimen for now until fever resolved, WBC is now finally down to normal limits but pt still with Tmax 100 F - fever curve overall trending down  - lactic acid is WNL which is reassuring and Procalcitonin level is trending down since one week ago - Suspect  aspiration event in the setting of nausea and vomiting to her pancreatitis, probably pneumonitis on admission and developed pneumonia with a fever  Acute hypoxic respiratory failure - Wean off oxygen as tolerated, chest x-ray today with persistent left base consolidation and small left pleural effusion. We'll repeat a dose of Lasix today.   Familial hypertriglyceridemia - patient presented with significantly elevated triglycerides greater than 5500 - treatment as noted above   Sinus tachycardia - likely from acute pancreatitis, no chest pain this AM - added Metoprolol as needed for HR > 110  UTI, yeast - added Diflucan 2/4, continue same regimen  Diabetes mellitus - Glucotrol on hold, added SSI for now  - added Lantus 15 U QHS  Left hand swelling - patient had left and swelling/pain and numbness due to IV infiltration - surgery saw on 10/13/2016 and did not think it was a compartment syndrome - keep hand elevated above heart, ice as needed.  Hypokalemia - on low end of normal, will add one more dose of K-dur   Morbid obesity - in pt with BMI > 36 and underlying DM, Body mass index is 38.72 kg/m.   DVT prophylaxis: heparin Code Status: Full code Family Communication: no family bedside Disposition Plan: home 1-2 days   Consultants:   PCCM  Procedures:   None   Antimicrobials:  Zosyn 1/28 -->  Vancomycin 2/3 --> 2/6  Diflucan 2/3 -->   Subjective: - no chest pain, shortness of breath, no abdominal pain,  nausea or vomiting. Very emotional, wants to go home, she has a 55-year-old boy and a 36 year old girl  Objective: Vitals:   10/23/16 0327 10/23/16 0500 10/23/16 0732 10/23/16 1144  BP: 133/89  124/75 109/71  Pulse:   93 89  Resp: 17  18 17   Temp: 98.7 F (37.1 C)  98.6 F (37 C) 98.8 F (37.1 C)  TempSrc: Oral  Oral Oral  SpO2: 98%  100% 100%  Weight:  104.6 kg (230 lb 8 oz)    Height:        Intake/Output Summary (Last 24 hours) at 10/23/16  1519 Last data filed at 10/23/16 1400  Gross per 24 hour  Intake              820 ml  Output                0 ml  Net              820 ml   Filed Weights   10/21/16 0628 10/22/16 0500 10/23/16 0500  Weight: 108.7 kg (239 lb 9.6 oz) 107.1 kg (236 lb 1.6 oz) 104.6 kg (230 lb 8 oz)    Examination: Constitutional: NAD Vitals:   10/23/16 0327 10/23/16 0500 10/23/16 0732 10/23/16 1144  BP: 133/89  124/75 109/71  Pulse:   93 89  Resp: 17  18 17   Temp: 98.7 F (37.1 C)  98.6 F (37 C) 98.8 F (37.1 C)  TempSrc: Oral  Oral Oral  SpO2: 98%  100% 100%  Weight:  104.6 kg (230 lb 8 oz)    Height:       Eyes: PERRL, lids and conjunctivae normal Respiratory: clear to auscultation bilaterally, no wheezing, no crackles. Normal respiratory effort. No accessory muscle use.  Cardiovascular: Regular rate and rhythm, no murmurs / rubs / gallops. No LE edema. 2+ pedal pulses.  Abdomen: no tenderness. Bowel sounds positive.  Musculoskeletal: no clubbing / cyanosis.  Skin: no rashes, lesions, ulcers. No induration Neurologic: non focal   Data Reviewed: I have personally reviewed following labs and imaging studies  CBC:  Recent Labs Lab 10/18/16 0529 10/19/16 0616 10/20/16 0540 10/21/16 0500 10/22/16 0500  WBC 17.3* 14.2* 13.6* 11.4* 9.0  HGB 9.2* 8.1* 7.9* 8.2* 8.6*  HCT 29.2* 26.0* 25.8* 26.1* 28.2*  MCV 89.8 89.3 89.9 90.3 89.5  PLT 421* 384 373 365 361   Basic Metabolic Panel:  Recent Labs Lab 10/18/16 0529 10/19/16 0616 10/20/16 0540 10/21/16 0500 10/22/16 0500  NA 134* 136 136 136 138  K 2.9* 3.3* 3.3* 3.3* 3.5  CL 94* 95* 98* 94* 97*  CO2 29 31 31  32 30  GLUCOSE 159* 152* 215* 219* 216*  BUN <5* <5* <5* <5* <5*  CREATININE 0.67 0.64 0.60 0.56 0.70  CALCIUM 7.9* 7.9* 7.9* 8.0* 8.2*  MG  --   --   --  1.8  --   PHOS  --   --   --  3.6  --    GFR: Estimated Creatinine Clearance: 119.9 mL/min (by C-G formula based on SCr of 0.7 mg/dL). Liver Function  Tests:  Recent Labs Lab 10/17/16 0340 10/21/16 0500  AST 23 23  ALT 18 19  ALKPHOS 60 56  BILITOT 1.0 0.7  PROT 5.6* 5.7*  ALBUMIN 1.6* 1.6*    Recent Labs Lab 10/18/16 0529 10/20/16 0540  LIPASE 20 21  AMYLASE 40  --    No results for input(s): AMMONIA in the last 168 hours.  Coagulation Profile: No results for input(s): INR, PROTIME in the last 168 hours. Cardiac Enzymes: No results for input(s): CKTOTAL, CKMB, CKMBINDEX, TROPONINI in the last 168 hours. BNP (last 3 results) No results for input(s): PROBNP in the last 8760 hours. HbA1C: No results for input(s): HGBA1C in the last 72 hours. CBG:  Recent Labs Lab 10/22/16 1132 10/22/16 1652 10/22/16 2041 10/23/16 0729 10/23/16 1144  GLUCAP 222* 166* 195* 145* 175*   Lipid Profile:  Recent Labs  10/22/16 0500 10/23/16 0329  TRIG 427* 435*   Thyroid Function Tests: No results for input(s): TSH, T4TOTAL, FREET4, T3FREE, THYROIDAB in the last 72 hours. Anemia Panel: No results for input(s): VITAMINB12, FOLATE, FERRITIN, TIBC, IRON, RETICCTPCT in the last 72 hours. Urine analysis:    Component Value Date/Time   COLORURINE YELLOW 10/18/2016 1745   APPEARANCEUR HAZY (A) 10/18/2016 1745   LABSPEC 1.012 10/18/2016 1745   PHURINE 6.0 10/18/2016 1745   GLUCOSEU 50 (A) 10/18/2016 1745   HGBUR LARGE (A) 10/18/2016 1745   BILIRUBINUR NEGATIVE 10/18/2016 1745   KETONESUR NEGATIVE 10/18/2016 1745   PROTEINUR 30 (A) 10/18/2016 1745   NITRITE NEGATIVE 10/18/2016 1745   LEUKOCYTESUR NEGATIVE 10/18/2016 1745   Sepsis Labs: Invalid input(s): PROCALCITONIN, LACTICIDVEN  Recent Results (from the past 240 hour(s))  Culture, blood (x 2)     Status: None (Preliminary result)   Collection Time: 10/18/16  4:55 PM  Result Value Ref Range Status   Specimen Description BLOOD LEFT ARM  Final   Special Requests BOTTLES DRAWN AEROBIC ONLY 5CC  Final   Culture NO GROWTH 4 DAYS  Final   Report Status PENDING  Incomplete   Culture, blood (x 2)     Status: None (Preliminary result)   Collection Time: 10/18/16  4:59 PM  Result Value Ref Range Status   Specimen Description BLOOD RIGHT ARM  Final   Special Requests IN PEDIATRIC BOTTLE 3CC  Final   Culture NO GROWTH 4 DAYS  Final   Report Status PENDING  Incomplete  Culture, Urine     Status: Abnormal   Collection Time: 10/18/16  5:45 PM  Result Value Ref Range Status   Specimen Description URINE, RANDOM  Final   Special Requests NONE  Final   Culture 60,000 COLONIES/mL YEAST (A)  Final   Report Status 10/19/2016 FINAL  Final      Radiology Studies: Dg Chest Port 1 View  Result Date: 10/23/2016 CLINICAL DATA:  Shortness of Breath EXAM: PORTABLE CHEST 1 VIEW COMPARISON:  Chest radiograph October 13, 2016 and chest CT October 18, 2016 FINDINGS: There is persistent consolidation in the left base with small left pleural effusion. Lungs elsewhere clear. Heart is upper normal in size with pulmonary vascularity within normal limits. Central catheter tip is at the cavoatrial junction. No pneumothorax. No adenopathy. No bone lesions. IMPRESSION: Persistent left base consolidation with small left pleural effusion. Lungs elsewhere clear. Stable cardiac silhouette. No pneumothorax. Electronically Signed   By: Bretta BangWilliam  Woodruff III M.D.   On: 10/23/2016 10:00     Scheduled Meds: . chlorhexidine  15 mL Mouth Rinse BID  . gemfibrozil  600 mg Oral BID AC  . heparin  5,000 Units Subcutaneous Q8H  . insulin aspart  0-9 Units Subcutaneous TID WC  . insulin glargine  15 Units Subcutaneous QHS  . lidocaine  1 patch Transdermal Q24H  . mouth rinse  15 mL Mouth Rinse q12n4p  . piperacillin-tazobactam (ZOSYN)  IV  3.375 g Intravenous Q8H  .  sodium chloride flush  10-40 mL Intracatheter Q12H   Continuous Infusions:  Pamella Pert, MD, PhD [facility] Pager [phone]  If 7PM-7AM, please contact night-coverage [email or web address] Password [facility] 10/23/2016, 3:19 PM

## 2016-10-23 NOTE — Progress Notes (Signed)
Physical Therapy Treatment Patient Details Name: Melissa Beasley MRN: [id number] DOB: 1981-04-08 Today's Date: 10/23/2016    History of Present Illness Patient is a 36 y/o female with hx of pancreatitis, DM presents with abdominal pain. Found to have Acute pancreatitis secondary to familial hypertriglyceridemia. Triglycerides greater than 5500 and lipase of 45    PT Comments    Patient progressing well with mobility. Tolerated stair training with supervision for safety. Pt with 2/4 DOE and Sp02 dropped to 89% on RA. HR up to 135 bpm. Pt reports feeling tired after mobility. Encouraged increasing activity while in the hospital. Pt improved ambulation distance today and eager to return home tomorrow. Pt has met all goals and all education completed. Pt does not require further skilled therapy services. Discharge from therapy.   Follow Up Recommendations  No PT follow up;Supervision - Intermittent     Equipment Recommendations  None recommended by PT    Recommendations for Other Services       Precautions / Restrictions Precautions Precautions: Fall Restrictions Weight Bearing Restrictions: No    Mobility  Bed Mobility               General bed mobility comments: Sitting on BSC upon PT arrival.   Transfers Overall transfer level: Modified independent               General transfer comment: Stood from [facility] without difficulty. Transferred to chair post ambulation.  Ambulation/Gait Ambulation/Gait assistance: Modified independent (Device/Increase time) Ambulation Distance (Feet): 300 Feet Assistive device: None Gait Pattern/deviations: Step-through pattern;Decreased stride length;Drifts right/left Gait velocity: decreased Gait velocity interpretation: Below normal speed for age/gender General Gait Details: Slow, mostly steady gait with some mild drifting. HR up to 135 bpm. Sp02 dropped to 89% on RA. 2/4 DOE.   Stairs Stairs: Yes   Stair Management: One rail  Left Number of Stairs: 4 General stair comments: Cues for safety.   Wheelchair Mobility    Modified Rankin (Stroke Patients Only)       Balance Overall balance assessment: Needs assistance Sitting-balance support: Feet supported;No upper extremity supported Sitting balance-Leahy Scale: Good Sitting balance - Comments: Able to donn socks without difficulty reaching outside bOS.   Standing balance support: During functional activity Standing balance-Leahy Scale: Good Standing balance comment: Able to wash hands at sink wtihout difficulty.                     Cognition Arousal/Alertness: Awake/alert Behavior During Therapy: WFL for tasks assessed/performed Overall Cognitive Status: Within Functional Limits for tasks assessed                      Exercises      General Comments        Pertinent Vitals/Pain Pain Assessment: Faces Faces Pain Scale: Hurts little more Pain Location: back- chronic Pain Descriptors / Indicators: Sore Pain Intervention(s): Monitored during session;Repositioned;Premedicated before session    Home Living                      Prior Function            PT Goals (current goals can now be found in the care plan section) Progress towards PT goals: Goals met/education completed, patient discharged from PT    Frequency    Min 3X/week      PT Plan Current plan remains appropriate    Co-evaluation  End of Session Equipment Utilized During Treatment: Gait belt Activity Tolerance: Patient tolerated treatment well Patient left: in chair;with call bell/phone within reach     Time: 1125-1140 PT Time Calculation (min) (ACUTE ONLY): 15 min  Charges:  $Therapeutic Exercise: 8-22 mins                    G Codes:      Marlyne Totaro A Bionca Mckey 10/23/2016, 12:06 PM Wray Kearns, [place], DPT [phone]

## 2016-10-24 LAB — BASIC METABOLIC PANEL
ANION GAP: 10 (ref 5–15)
BUN: 6 mg/dL (ref 6–20)
CALCIUM: 8.1 mg/dL — AB (ref 8.9–10.3)
CO2: 31 mmol/L (ref 22–32)
Chloride: 97 mmol/L — ABNORMAL LOW (ref 101–111)
Creatinine, Ser: 0.65 mg/dL (ref 0.44–1.00)
Glucose, Bld: 180 mg/dL — ABNORMAL HIGH (ref 65–99)
POTASSIUM: 3.5 mmol/L (ref 3.5–5.1)
Sodium: 138 mmol/L (ref 135–145)

## 2016-10-24 LAB — GLUCOSE, CAPILLARY
GLUCOSE-CAPILLARY: 159 mg/dL — AB (ref 65–99)
GLUCOSE-CAPILLARY: 173 mg/dL — AB (ref 65–99)

## 2016-10-24 LAB — TRIGLYCERIDES: Triglycerides: 394 mg/dL — ABNORMAL HIGH (ref <150)

## 2016-10-24 MED ORDER — GEMFIBROZIL 600 MG PO TABS: 600.0000 mg | ORAL_TABLET | Freq: Two times a day (BID) | ORAL | 1 refills | Status: AC

## 2016-10-24 MED ORDER — OXYCODONE-ACETAMINOPHEN 5-325 MG PO TABS: 1.0000 | ORAL_TABLET | ORAL | 0 refills | Status: DC | PRN

## 2016-10-24 MED ORDER — AMOXICILLIN-POT CLAVULANATE 875-125 MG PO TABS: 1.0000 | ORAL_TABLET | Freq: Two times a day (BID) | ORAL | 0 refills | Status: DC

## 2016-10-24 MED ORDER — FENOFIBRATE 145 MG PO TABS: 145.0000 mg | ORAL_TABLET | Freq: Every day | ORAL | 1 refills | Status: DC

## 2016-10-24 MED ORDER — POTASSIUM CHLORIDE ER 10 MEQ PO TBCR: 10.0000 meq | EXTENDED_RELEASE_TABLET | Freq: Every day | ORAL | 0 refills | Status: DC

## 2016-10-24 MED ORDER — FUROSEMIDE 20 MG PO TABS: 20.0000 mg | ORAL_TABLET | Freq: Every day | ORAL | 0 refills | Status: DC

## 2016-10-24 NOTE — Discharge Instructions (Signed)
Follow with COLLINS, DANA, DO in 1-2 weeks  Please get a complete blood count and chemistry panel checked by your Primary MD at your next visit, and again as instructed by your Primary MD. Please get your medications reviewed and adjusted by your Primary MD.  Please request your Primary MD to go over all Hospital Tests and Procedure/Radiological results at the follow up, please get all Hospital records sent to your Prim MD by signing hospital release before you go home.  If you had Pneumonia of Lung problems at the Hospital: Please get a 2 view Chest X ray done in 6-8 weeks after hospital discharge or sooner if instructed by your Primary MD.  If you have Congestive Heart Failure: Please call your Cardiologist or Primary MD anytime you have any of the following symptoms:  1) 3 pound weight gain in 24 hours or 5 pounds in 1 week  2) shortness of breath, with or without a dry hacking cough  3) swelling in the hands, feet or stomach  4) if you have to sleep on extra pillows at night in order to breathe  Follow cardiac low salt diet and 1.5 lit/day fluid restriction.  If you have diabetes Accuchecks 4 times/day, Once in AM empty stomach and then before each meal. Log in all results and show them to your primary doctor at your next visit. If any glucose reading is under 80 or above 300 call your primary MD immediately.  If you have Seizure/Convulsions/Epilepsy: Please do not drive, operate heavy machinery, participate in activities at heights or participate in high speed sports until you have seen by Primary MD or a Neurologist and advised to do so again.  If you had Gastrointestinal Bleeding: Please ask your Primary MD to check a complete blood count within one week of discharge or at your next visit. Your endoscopic/colonoscopic biopsies that are pending at the time of discharge, will also need to followed by your Primary MD.  Get Medicines reviewed and adjusted. Please take all your  medications with you for your next visit with your Primary MD  Please request your Primary MD to go over all hospital tests and procedure/radiological results at the follow up, please ask your Primary MD to get all Hospital records sent to his/her office.  If you experience worsening of your admission symptoms, develop shortness of breath, life threatening emergency, suicidal or homicidal thoughts you must seek medical attention immediately by calling 911 or calling your MD immediately  if symptoms less severe.  You must read complete instructions/literature along with all the possible adverse reactions/side effects for all the Medicines you take and that have been prescribed to you. Take any new Medicines after you have completely understood and accpet all the possible adverse reactions/side effects.   Do not drive or operate heavy machinery when taking Pain medications.   Do not take more than prescribed Pain, Sleep and Anxiety Medications  Special Instructions: If you have smoked or chewed Tobacco  in the last 2 yrs please stop smoking, stop any regular Alcohol  and or any Recreational drug use.  Wear Seat belts while driving.  Please note You were cared for by a hospitalist during your hospital stay. If you have any questions about your discharge medications or the care you received while you were in the hospital after you are discharged, you can call the unit and asked to speak with the hospitalist on call if the hospitalist that took care of you is not available. Once  you are discharged, your primary care physician will handle any further medical issues. Please note that NO REFILLS for any discharge medications will be authorized once you are discharged, as it is imperative that you return to your primary care physician (or establish a relationship with a primary care physician if you do not have one) for your aftercare needs so that they can reassess your need for medications and monitor your  lab values.  You can reach the hospitalist office at phone [phone] or fax [phone]   If you do not have a primary care physician, you can call [phone] for a physician referral.  Activity: As tolerated with Full fall precautions use walker/cane & assistance as needed  Diet: low fat  Disposition Home     Low-Fat Diet for Pancreatitis or Gallbladder Conditions A low-fat diet can be helpful if you have pancreatitis or a gallbladder condition. With these conditions, your pancreas and gallbladder have trouble digesting fats. A healthy eating plan with less fat will help rest your pancreas and gallbladder and reduce your symptoms. What do I need to know about this diet?  Eat a low-fat diet.  Reduce your fat intake to less than 20-30% of your total daily calories. This is less than 50-60 g of fat per day.  Remember that you need some fat in your diet. Ask your dietician what your daily goal should be.  Choose nonfat and low-fat healthy foods. Look for the words nonfat, low fat, or fat free.  As a guide, look on the label and choose foods with less than 3 g of fat per serving. Eat only one serving.  Avoid alcohol.  Do not smoke. If you need help quitting, talk with your health care provider.  Eat small frequent meals instead of three large heavy meals. What foods can I eat? Grains  Include healthy grains and starches such as potatoes, wheat bread, fiber-rich cereal, and brown rice. Choose whole grain options whenever possible. In adults, whole grains should account for 45-65% of your daily calories. Fruits and Vegetables  Eat plenty of fruits and vegetables. Fresh fruits and vegetables add fiber to your diet. Meats and Other Protein Sources  Eat lean meat such as chicken and pork. Trim any fat off of meat before cooking it. Eggs, fish, and beans are other sources of protein. In adults, these foods should account for 10-35% of your daily calories. Dairy  Choose low-fat  milk and dairy options. Dairy includes fat and protein, as well as calcium. Fats and Oils  Limit high-fat foods such as fried foods, sweets, baked goods, sugary drinks. Other  Creamy sauces and condiments, such as mayonnaise, can add extra fat. Think about whether or not you need to use them, or use smaller amounts or low fat options. What foods are not recommended?  High fat foods, such as:  [employer].  Ice cream.  [place] toast.  Sweet rolls.  Pizza.  Cheese bread.  Foods covered with batter, butter, creamy sauces, or cheese.  Fried foods.  Sugary drinks and desserts.  Foods that cause gas or bloating This information is not intended to replace advice given to you by your health care provider. Make sure you discuss any questions you have with your health care provider. Document Released: 09/07/2013 Document Revised: 02/08/2016 Document Reviewed: 08/16/2013 Elsevier Interactive Patient Education  2017 Elsevier Inc.   Acute Pancreatitis Introduction Acute pancreatitis is a condition in which the pancreas suddenly gets irritated and swollen (has inflammation). The pancreas is  a large gland behind the stomach. It makes enzymes that help to digest food. The pancreas also makes hormones that help to control your blood sugar. Acute pancreatitis happens when the enzymes attack the pancreas and damage it. Most attacks last a couple of days and can cause serious problems. Follow these instructions at home: Eating and drinking  Follow instructions from your doctor about diet. You may need to:  Avoid alcohol.  Limit how much fat is in your diet.  Eat small meals often. Avoid eating big meals.  Drink enough fluid to keep your pee (urine) clear or pale yellow.  Do not drink alcohol if it caused your condition. General instructions  Take over-the-counter and prescription medicines only as told by your doctor.  Do not use any tobacco products. These include cigarettes,  chewing tobacco, and e-cigarettes. If you need help quitting, ask your doctor.  Get plenty of rest.  If directed, check your blood sugar at home as told by your doctor.  Keep all follow-up visits as told by your doctor. This is important. Contact a doctor if:  You do not get better as quickly as expected.  You have new symptoms.  Your symptoms get worse.  You have lasting pain or weakness.  You continue to feel sick to your stomach (nauseous).  You get better and then you have another pain attack.  You have a fever. Get help right away if:  You cannot eat or keep fluids down.  Your pain becomes very bad.  Your skin or the white part of your eyes turns yellow (jaundice).  You throw up (vomit).  You feel dizzy or you pass out (faint).  Your blood sugar is high (over 300 mg/dL). This information is not intended to replace advice given to you by your health care provider. Make sure you discuss any questions you have with your health care provider. Document Released: 02/19/2008 Document Revised: 02/08/2016 Document Reviewed: 06/06/2015  2017 Elsevier

## 2016-10-24 NOTE — Discharge Summary (Signed)
Physician Discharge Summary  Melissa Beasley ZOX:[id number] DOB: 22-May-1981 DOA: 10/12/2016  PCP: Irena Reichmann, DO  Admit date: 10/12/2016 Discharge date: 10/24/2016  Admitted From: home Disposition:  home  Recommendations for Outpatient Follow-up:  1. Follow up with PCP in 1-2 weeks  Home Health: none Equipment/Devices: none  Discharge Condition: stable CODE STATUS: Full code Diet recommendation: low fat  HPI: Per Dr. Eugenia Pancoast, Presented to [facility] on Friday w/ 1 day hx of abd pain. She has a PMHx of DM2, and apparently familial hypertriglyceridemia, although it is not known if this was known prior to her presentation. She was found to have pancreatitis with triglycerides of >5500, with a lipase of 450. RUQ showed a fatty liver, but no biliary obstruction. CT and subsequent study showed evolving pancreatic edema, but without necrosis or fluid collection. She was transferred to [facility] for additional therapy.  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 Lipase is now trending down and is WNL, TG's are also trending down. She underwent a CT scan abdomen and pelvis and confirmed significant inflammation but no signs of pseudocyst. Her diet was advanced and she was able to tolerate a regular diet without further issues. Continue gemfibrozil and fenofibrateon discharge Sepsis due to aspiration pneumonia - patient likely with aspiration during her nausea/vomiting episodes. Fever curve improved, breathing comfortable on room air, her Zosyn was changed to Augmentin and will complete 4 additional days. Lactic acid is WNL which is reassuring and  Procalcitonin level is trending down. Acute hypoxic respiratory failure - Wean off oxygen as tolerated, chest x-ray 2/7 stable. Component of slight fluid overload following initial fluid resuscitation, received IV Lasix x 2, will need po Lasix for few additional days following d/c  Familial hypertriglyceridemia - patient presented with significantly elevated triglycerides greater than 5500, treatment as noted above, counseled regarding low fat diet Sinus tachycardia - likely from acute pancreatitis, resolved UTI, yeast - s/p DIflucan Diabetes mellitus - resume home medications Left hand swelling - patient had left and swelling/pain and numbness due to IV infiltration, surgery saw on 10/13/2016 and did not think it was a compartment syndrome, improved Hypokalemia - resolved, K at home while on Lasix Morbid obesity - in pt with BMI > 36 and underlying DM, Body mass index is 38.72 kg/m.   Discharge Instructions   Allergies as of 10/24/2016   No Known Allergies     Medication List    TAKE these medications   amoxicillin-clavulanate 875-125 MG tablet Commonly known as:  AUGMENTIN Take 1 tablet by mouth 2 (two) times daily.   fenofibrate 145 MG tablet Commonly known as:  TRICOR Take 1 tablet (145 mg total) by mouth daily.   furosemide 20 MG tablet Commonly known as:  LASIX Take 1 tablet (20 mg total) by mouth daily. For 5 days   gemfibrozil 600 MG tablet Commonly known as:  LOPID Take 1 tablet (600 mg total) by mouth 2 (two) times daily before a meal.   glipiZIDE 2.5 MG 24 hr tablet Commonly known as:  GLUCOTROL XL Take 2.5 mg by mouth daily.   oxyCODONE-acetaminophen 5-325 MG tablet Commonly known as:  PERCOCET/ROXICET Take 1-2 tablets by mouth every 4 (four) hours as needed  for severe pain.   potassium chloride 10 MEQ tablet Commonly known as:  K-DUR Take 1 tablet (10 mEq total) by mouth daily.      Follow-up Information    COLLINS, DANA, DO. Schedule an appointment as  soon as possible for a visit in 2 week(s).   Specialty:  Family Medicine Contact information: [address] [place] [address] [phone]          No Known Allergies  Consultations:  PCCM  Procedures/Studies:  Ct Chest W Contrast  Result Date: 10/18/2016 CLINICAL DATA:  Concern for sepsis in patient with pancreatitis. Further evaluation requested. Initial encounter. EXAM: CT CHEST, ABDOMEN, AND PELVIS WITH CONTRAST TECHNIQUE: Multidetector CT imaging of the chest, abdomen and pelvis was performed following the standard protocol during bolus administration of intravenous contrast. CONTRAST:  100 mL ISOVUE-300 IOPAMIDOL (ISOVUE-300) INJECTION 61% COMPARISON:  CT of the abdomen and pelvis performed 10/11/2016, and chest radiograph performed 10/13/2016 FINDINGS: CT CHEST FINDINGS Cardiovascular: The heart is normal in size. Scattered coronary artery calcifications are seen. A right IJ line is noted ending about the distal SVC. The thoracic aorta is grossly unremarkable. The great vessels are grossly unremarkable. Mediastinum/Nodes: The mediastinum is grossly unremarkable in appearance. No mediastinal lymphadenopathy is seen. No pericardial effusion is identified. The visualized portions of the thyroid gland are unremarkable. No axillary lymphadenopathy is seen. A small nodule at the right breast is grossly stable from 2008 and likely reflects normal fibroglandular tissue. Lungs/Pleura: Small left and trace right pleural effusions are noted. Bibasilar airspace opacities may reflect atelectasis or possibly pneumonia. No pneumothorax is seen. No dominant mass is identified. Musculoskeletal: No acute osseous abnormalities are identified. The visualized musculature is unremarkable in appearance. CT ABDOMEN PELVIS FINDINGS Hepatobiliary: There is diffuse fatty infiltration within the liver. The gallbladder is partially filled with contrast. The common bile duct is not well characterized.  Pancreas: There is diffuse soft tissue inflammation about the pancreas, with associated fluid tracking about the stomach, small bowel loops, liver, spleen, and along both paracolic gutters into the pelvis. This is compatible with relatively severe acute pancreatitis. No well defined pseudocyst is seen. There is no definite evidence of devascularization at this time. Spleen: The spleen is grossly unremarkable in appearance. Adrenals/Urinary Tract: The adrenal glands are unremarkable in appearance. The kidneys are within normal limits. There is no evidence of hydronephrosis. No renal or ureteral stones are identified. Nonspecific perinephric stranding is noted bilaterally. Stomach/Bowel: The stomach is unremarkable in appearance. The small bowel is within normal limits. The appendix is normal in caliber, without evidence of appendicitis. The colon is unremarkable in appearance. Vascular/Lymphatic: Minimal calcification is seen along the abdominal aorta. No retroperitoneal or pelvic sidewall lymphadenopathy is seen. Reproductive: The bladder is mildly distended and grossly unremarkable. The uterus is grossly unremarkable. The ovaries are relatively symmetric. No suspicious adnexal masses are seen. Other: Diffuse soft tissue edema is seen along the abdominal and pelvic wall, likely reflecting anasarca. A small anterior abdominal wall hernia is seen just superior to the umbilicus, containing only fat. Musculoskeletal: No acute osseous abnormalities are identified. Vacuum phenomenon is noted at L5-S1. The visualized musculature is unremarkable in appearance. IMPRESSION: 1. Acute pancreatitis, with diffuse soft tissue inflammation about the pancreas, and small to moderate volume ascites within the abdomen and pelvis. No well defined pseudocyst seen. No definite evidence of devascularization at this time. 2. Small left and trace right pleural effusions. Bibasilar airspace opacities may reflect atelectasis or possibly  pneumonia.  3. Diffuse soft tissue edema along the abdominal and pelvic wall, likely reflecting anasarca. 4. Scattered coronary artery calcifications seen. 5. Diffuse fatty infiltration within the liver. 6. Small anterior abdominal wall hernia just superior to the umbilicus, containing only fat. Electronically Signed   By: Roanna RaiderJeffery  Chang M.D.   On: 10/18/2016 22:29   Ct Abdomen Pelvis W Contrast  Result Date: 10/18/2016 CLINICAL DATA:  Concern for sepsis in patient with pancreatitis. Further evaluation requested. Initial encounter. EXAM: CT CHEST, ABDOMEN, AND PELVIS WITH CONTRAST TECHNIQUE: Multidetector CT imaging of the chest, abdomen and pelvis was performed following the standard protocol during bolus administration of intravenous contrast. CONTRAST:  100 mL ISOVUE-300 IOPAMIDOL (ISOVUE-300) INJECTION 61% COMPARISON:  CT of the abdomen and pelvis performed 10/11/2016, and chest radiograph performed 10/13/2016 FINDINGS: CT CHEST FINDINGS Cardiovascular: The heart is normal in size. Scattered coronary artery calcifications are seen. A right IJ line is noted ending about the distal SVC. The thoracic aorta is grossly unremarkable. The great vessels are grossly unremarkable. Mediastinum/Nodes: The mediastinum is grossly unremarkable in appearance. No mediastinal lymphadenopathy is seen. No pericardial effusion is identified. The visualized portions of the thyroid gland are unremarkable. No axillary lymphadenopathy is seen. A small nodule at the right breast is grossly stable from 2008 and likely reflects normal fibroglandular tissue. Lungs/Pleura: Small left and trace right pleural effusions are noted. Bibasilar airspace opacities may reflect atelectasis or possibly pneumonia. No pneumothorax is seen. No dominant mass is identified. Musculoskeletal: No acute osseous abnormalities are identified. The visualized musculature is unremarkable in appearance. CT ABDOMEN PELVIS FINDINGS Hepatobiliary: There is diffuse  fatty infiltration within the liver. The gallbladder is partially filled with contrast. The common bile duct is not well characterized. Pancreas: There is diffuse soft tissue inflammation about the pancreas, with associated fluid tracking about the stomach, small bowel loops, liver, spleen, and along both paracolic gutters into the pelvis. This is compatible with relatively severe acute pancreatitis. No well defined pseudocyst is seen. There is no definite evidence of devascularization at this time. Spleen: The spleen is grossly unremarkable in appearance. Adrenals/Urinary Tract: The adrenal glands are unremarkable in appearance. The kidneys are within normal limits. There is no evidence of hydronephrosis. No renal or ureteral stones are identified. Nonspecific perinephric stranding is noted bilaterally. Stomach/Bowel: The stomach is unremarkable in appearance. The small bowel is within normal limits. The appendix is normal in caliber, without evidence of appendicitis. The colon is unremarkable in appearance. Vascular/Lymphatic: Minimal calcification is seen along the abdominal aorta. No retroperitoneal or pelvic sidewall lymphadenopathy is seen. Reproductive: The bladder is mildly distended and grossly unremarkable. The uterus is grossly unremarkable. The ovaries are relatively symmetric. No suspicious adnexal masses are seen. Other: Diffuse soft tissue edema is seen along the abdominal and pelvic wall, likely reflecting anasarca. A small anterior abdominal wall hernia is seen just superior to the umbilicus, containing only fat. Musculoskeletal: No acute osseous abnormalities are identified. Vacuum phenomenon is noted at L5-S1. The visualized musculature is unremarkable in appearance. IMPRESSION: 1. Acute pancreatitis, with diffuse soft tissue inflammation about the pancreas, and small to moderate volume ascites within the abdomen and pelvis. No well defined pseudocyst seen. No definite evidence of  devascularization at this time. 2. Small left and trace right pleural effusions. Bibasilar airspace opacities may reflect atelectasis or possibly pneumonia. 3. Diffuse soft tissue edema along the abdominal and pelvic wall, likely reflecting anasarca. 4. Scattered coronary artery calcifications seen. 5. Diffuse fatty infiltration within the liver. 6. Small anterior  abdominal wall hernia just superior to the umbilicus, containing only fat. Electronically Signed   By: Roanna Raider M.D.   On: 10/18/2016 22:29   Dg Chest Port 1 View  Result Date: 10/23/2016 CLINICAL DATA:  Shortness of Breath EXAM: PORTABLE CHEST 1 VIEW COMPARISON:  Chest radiograph October 13, 2016 and chest CT October 18, 2016 FINDINGS: There is persistent consolidation in the left base with small left pleural effusion. Lungs elsewhere clear. Heart is upper normal in size with pulmonary vascularity within normal limits. Central catheter tip is at the cavoatrial junction. No pneumothorax. No adenopathy. No bone lesions. IMPRESSION: Persistent left base consolidation with small left pleural effusion. Lungs elsewhere clear. Stable cardiac silhouette. No pneumothorax. Electronically Signed   By: Bretta Bang III M.D.   On: 10/23/2016 10:00   Dg Chest Port 1 View  Result Date: 10/13/2016 CLINICAL DATA:  Shortness of Breath EXAM: PORTABLE CHEST 1 VIEW COMPARISON:  10/12/2016 FINDINGS: Very low lung volumes with bilateral airspace opacities. Heart is borderline in size. No definite effusions. Right central line and NG tube are unchanged. IMPRESSION: Very low lung volumes with diffuse bilateral airspace disease, worsened since prior study. Electronically Signed   By: Charlett Nose M.D.   On: 10/13/2016 07:38   Dg Chest Port 1 View  Result Date: 10/12/2016 CLINICAL DATA:  Acute respiratory distress EXAM: PORTABLE CHEST 1 VIEW COMPARISON:  10/12/2016 FINDINGS: Right jugular central line and nasogastric catheter are noted in satisfactory  position. Cardiac shadow is stable. The overall inspiratory effort is poor with persistent left basilar atelectasis. No new focal abnormality is seen. IMPRESSION: No change from the prior exam. Electronically Signed   By: Alcide Clever M.D.   On: 10/12/2016 18:32   Dg Abd Portable 1v  Result Date: 10/13/2016 CLINICAL DATA:  Patient with ileus.  NG tube placement. EXAM: PORTABLE ABDOMEN - 1 VIEW COMPARISON:  CT abdomen pelvis 10/11/2016. FINDINGS: Enteric tube tip and side-port project over the stomach. Gas is demonstrated within mildly dilated loops of small bowel within the central and left hemiabdomen. At lumbar spine degenerative changes. IMPRESSION: Enteric tube tip and side-port project over the stomach. Suspect mild ileus. Electronically Signed   By: Annia Belt M.D.   On: 10/13/2016 11:28      Subjective: - no chest pain, shortness of breath, no abdominal pain, nausea or vomiting. Asking to go home  Discharge Exam: Vitals:   10/24/16 0638 10/24/16 0829  BP: 115/72 124/80  Pulse: 94 93  Resp:    Temp:  98.1 F (36.7 C)   Vitals:   10/24/16 0528 10/24/16 0636 10/24/16 0638 10/24/16 0829  BP:   115/72 124/80  Pulse:   94 93  Resp:  (!) 23    Temp:  99.2 F (37.3 C)  98.1 F (36.7 C)  TempSrc:  Oral  Oral  SpO2:   96% 99%  Weight: 103.4 kg (227 lb 14.4 oz)     Height:        General: Pt is alert, awake, not in acute distress Cardiovascular: RRR, S1/S2 +, no rubs, no gallops Respiratory: CTA bilaterally, no wheezing, no rhonchi    The results of significant diagnostics from this hospitalization (including imaging, microbiology, ancillary and laboratory) are listed below for reference.     Microbiology: Recent Results (from the past 240 hour(s))  Culture, blood (x 2)     Status: None   Collection Time: 10/18/16  4:55 PM  Result Value Ref Range Status   Specimen  Description BLOOD LEFT ARM  Final   Special Requests BOTTLES DRAWN AEROBIC ONLY 5CC  Final   Culture NO  GROWTH 5 DAYS  Final   Report Status 10/23/2016 FINAL  Final  Culture, blood (x 2)     Status: None   Collection Time: 10/18/16  4:59 PM  Result Value Ref Range Status   Specimen Description BLOOD RIGHT ARM  Final   Special Requests IN PEDIATRIC BOTTLE 3CC  Final   Culture NO GROWTH 5 DAYS  Final   Report Status 10/23/2016 FINAL  Final  Culture, Urine     Status: Abnormal   Collection Time: 10/18/16  5:45 PM  Result Value Ref Range Status   Specimen Description URINE, RANDOM  Final   Special Requests NONE  Final   Culture 60,000 COLONIES/mL YEAST (A)  Final   Report Status 10/19/2016 FINAL  Final     Labs: BNP (last 3 results)  Recent Labs  10/12/16 1833  BNP 190.0*   Basic Metabolic Panel:  Recent Labs Lab 10/19/16 0616 10/20/16 0540 10/21/16 0500 10/22/16 0500 10/24/16 0500  NA 136 136 136 138 138  K 3.3* 3.3* 3.3* 3.5 3.5  CL 95* 98* 94* 97* 97*  CO2 31 31 32 30 31  GLUCOSE 152* 215* 219* 216* 180*  BUN <5* <5* <5* <5* 6  CREATININE 0.64 0.60 0.56 0.70 0.65  CALCIUM 7.9* 7.9* 8.0* 8.2* 8.1*  MG  --   --  1.8  --   --   PHOS  --   --  3.6  --   --    Liver Function Tests:  Recent Labs Lab 10/21/16 0500  AST 23  ALT 19  ALKPHOS 56  BILITOT 0.7  PROT 5.7*  ALBUMIN 1.6*    Recent Labs Lab 10/18/16 0529 10/20/16 0540  LIPASE 20 21  AMYLASE 40  --    No results for input(s): AMMONIA in the last 168 hours. CBC:  Recent Labs Lab 10/18/16 0529 10/19/16 0616 10/20/16 0540 10/21/16 0500 10/22/16 0500  WBC 17.3* 14.2* 13.6* 11.4* 9.0  HGB 9.2* 8.1* 7.9* 8.2* 8.6*  HCT 29.2* 26.0* 25.8* 26.1* 28.2*  MCV 89.8 89.3 89.9 90.3 89.5  PLT 421* 384 373 365 361   Cardiac Enzymes: No results for input(s): CKTOTAL, CKMB, CKMBINDEX, TROPONINI in the last 168 hours. BNP: Invalid input(s): POCBNP CBG:  Recent Labs Lab 10/23/16 1144 10/23/16 1622 10/23/16 2133 10/24/16 0756 10/24/16 1136  GLUCAP 175* 238* 177* 159* 173*   D-Dimer No results  for input(s): DDIMER in the last 72 hours. Hgb A1c No results for input(s): HGBA1C in the last 72 hours. Lipid Profile  Recent Labs  10/23/16 0329 10/24/16 0500  TRIG 435* 394*   Thyroid function studies No results for input(s): TSH, T4TOTAL, T3FREE, THYROIDAB in the last 72 hours.  Invalid input(s): FREET3 Anemia work up No results for input(s): VITAMINB12, FOLATE, FERRITIN, TIBC, IRON, RETICCTPCT in the last 72 hours. Urinalysis    Component Value Date/Time   COLORURINE YELLOW 10/18/2016 1745   APPEARANCEUR HAZY (A) 10/18/2016 1745   LABSPEC 1.012 10/18/2016 1745   PHURINE 6.0 10/18/2016 1745   GLUCOSEU 50 (A) 10/18/2016 1745   HGBUR LARGE (A) 10/18/2016 1745   BILIRUBINUR NEGATIVE 10/18/2016 1745   KETONESUR NEGATIVE 10/18/2016 1745   PROTEINUR 30 (A) 10/18/2016 1745   NITRITE NEGATIVE 10/18/2016 1745   LEUKOCYTESUR NEGATIVE 10/18/2016 1745   Sepsis Labs Invalid input(s): PROCALCITONIN,  WBC,  LACTICIDVEN Microbiology Recent Results (from  the past 240 hour(s))  Culture, blood (x 2)     Status: None   Collection Time: 10/18/16  4:55 PM  Result Value Ref Range Status   Specimen Description BLOOD LEFT ARM  Final   Special Requests BOTTLES DRAWN AEROBIC ONLY 5CC  Final   Culture NO GROWTH 5 DAYS  Final   Report Status 10/23/2016 FINAL  Final  Culture, blood (x 2)     Status: None   Collection Time: 10/18/16  4:59 PM  Result Value Ref Range Status   Specimen Description BLOOD RIGHT ARM  Final   Special Requests IN PEDIATRIC BOTTLE 3CC  Final   Culture NO GROWTH 5 DAYS  Final   Report Status 10/23/2016 FINAL  Final  Culture, Urine     Status: Abnormal   Collection Time: 10/18/16  5:45 PM  Result Value Ref Range Status   Specimen Description URINE, RANDOM  Final   Special Requests NONE  Final   Culture 60,000 COLONIES/mL YEAST (A)  Final   Report Status 10/19/2016 FINAL  Final     Time coordinating discharge: 46  SIGNED:  Pamella Pert, MD  [facility] 10/24/2016, 2:45 PM Pager [phone]  If 7PM-7AM, please contact night-coverage [email or web address] Password [email or web address]

## 2016-10-30 ENCOUNTER — Encounter (HOSPITAL_COMMUNITY): Payer: Self-pay | Admitting: Internal Medicine

## 2016-10-30 ENCOUNTER — Inpatient Hospital Stay (HOSPITAL_COMMUNITY)
Admission: AD | Admit: 2016-10-30 | Discharge: 2016-11-04 | DRG: 440 | Disposition: A | Discharge status: Home / Self Care | Payer: BLUE CROSS/BLUE SHIELD | Source: Other Acute Inpatient Hospital | Attending: Internal Medicine | Admitting: Internal Medicine

## 2016-10-30 DIAGNOSIS — K858 Other acute pancreatitis without necrosis or infection: Secondary | ICD-10-CM | POA: Diagnosis not present

## 2016-10-30 DIAGNOSIS — K760 Fatty (change of) liver, not elsewhere classified: Secondary | ICD-10-CM | POA: Diagnosis present

## 2016-10-30 DIAGNOSIS — K851 Biliary acute pancreatitis without necrosis or infection: Secondary | ICD-10-CM

## 2016-10-30 DIAGNOSIS — E781 Pure hyperglyceridemia: Secondary | ICD-10-CM | POA: Diagnosis present

## 2016-10-30 DIAGNOSIS — R197 Diarrhea, unspecified: Secondary | ICD-10-CM | POA: Diagnosis present

## 2016-10-30 DIAGNOSIS — Z7984 Long term (current) use of oral hypoglycemic drugs: Secondary | ICD-10-CM | POA: Diagnosis not present

## 2016-10-30 DIAGNOSIS — K863 Pseudocyst of pancreas: Secondary | ICD-10-CM | POA: Diagnosis present

## 2016-10-30 DIAGNOSIS — Z87891 Personal history of nicotine dependence: Secondary | ICD-10-CM | POA: Diagnosis not present

## 2016-10-30 DIAGNOSIS — F419 Anxiety disorder, unspecified: Secondary | ICD-10-CM | POA: Diagnosis present

## 2016-10-30 DIAGNOSIS — R109 Unspecified abdominal pain: Secondary | ICD-10-CM

## 2016-10-30 DIAGNOSIS — K8500 Idiopathic acute pancreatitis without necrosis or infection: Secondary | ICD-10-CM | POA: Diagnosis not present

## 2016-10-30 DIAGNOSIS — Z6833 Body mass index (BMI) 33.0-33.9, adult: Secondary | ICD-10-CM | POA: Diagnosis not present

## 2016-10-30 DIAGNOSIS — D649 Anemia, unspecified: Secondary | ICD-10-CM | POA: Diagnosis present

## 2016-10-30 DIAGNOSIS — R1030 Lower abdominal pain, unspecified: Secondary | ICD-10-CM | POA: Diagnosis present

## 2016-10-30 DIAGNOSIS — E1165 Type 2 diabetes mellitus with hyperglycemia: Secondary | ICD-10-CM | POA: Diagnosis present

## 2016-10-30 DIAGNOSIS — K859 Acute pancreatitis without necrosis or infection, unspecified: Secondary | ICD-10-CM | POA: Diagnosis present

## 2016-10-30 LAB — GLUCOSE, CAPILLARY
GLUCOSE-CAPILLARY: 145 mg/dL — AB (ref 65–99)
GLUCOSE-CAPILLARY: 150 mg/dL — AB (ref 65–99)
Glucose-Capillary: 170 mg/dL — ABNORMAL HIGH (ref 65–99)
Glucose-Capillary: 194 mg/dL — ABNORMAL HIGH (ref 65–99)
Glucose-Capillary: 115 mg/dL — ABNORMAL HIGH (ref 65–99)

## 2016-10-30 LAB — CBC WITH DIFFERENTIAL/PLATELET
Basophils Absolute: 0.1 10*3/uL (ref 0.0–0.1)
Basophils Relative: 1 %
EOS PCT: 2 %
Eosinophils Absolute: 0.2 10*3/uL (ref 0.0–0.7)
HEMATOCRIT: 24.5 % — AB (ref 36.0–46.0)
Hemoglobin: 7.6 g/dL — ABNORMAL LOW (ref 12.0–15.0)
LYMPHS ABS: 3 10*3/uL (ref 0.7–4.0)
LYMPHS PCT: 28 %
MCH: 27.4 pg (ref 26.0–34.0)
MCHC: 31 g/dL (ref 30.0–36.0)
MCV: 88.4 fL (ref 78.0–100.0)
MONO ABS: 0.6 10*3/uL (ref 0.1–1.0)
MONOS PCT: 5 %
Neutro Abs: 7 10*3/uL (ref 1.7–7.7)
Neutrophils Relative %: 64 %
PLATELETS: 524 10*3/uL — AB (ref 150–400)
RBC: 2.77 MIL/uL — ABNORMAL LOW (ref 3.87–5.11)
RDW: 14.3 % (ref 11.5–15.5)
WBC: 10.9 10*3/uL — ABNORMAL HIGH (ref 4.0–10.5)

## 2016-10-30 LAB — BASIC METABOLIC PANEL
Anion gap: 10 (ref 5–15)
BUN: 15 mg/dL (ref 6–20)
CALCIUM: 8.9 mg/dL (ref 8.9–10.3)
CO2: 26 mmol/L (ref 22–32)
Chloride: 102 mmol/L (ref 101–111)
Creatinine, Ser: 1.23 mg/dL — ABNORMAL HIGH (ref 0.44–1.00)
GFR calc Af Amer: >60 mL/min (ref >60)
GFR, EST NON AFRICAN AMERICAN: 56 mL/min — AB (ref >60)
GLUCOSE: 181 mg/dL — AB (ref 65–99)
Potassium: 4.4 mmol/L (ref 3.5–5.1)
Sodium: 138 mmol/L (ref 135–145)

## 2016-10-30 LAB — TRIGLYCERIDES: Triglycerides: 315 mg/dL — ABNORMAL HIGH (ref <150)

## 2016-10-30 LAB — IRON AND TIBC
IRON: 25 ug/dL — AB (ref 28–170)
SATURATION RATIOS: 8 % — AB (ref 10.4–31.8)
TIBC: 305 ug/dL (ref 250–450)
UIBC: 280 ug/dL

## 2016-10-30 LAB — HEPATIC FUNCTION PANEL
ALBUMIN: 2.6 g/dL — AB (ref 3.5–5.0)
ALK PHOS: 49 U/L (ref 38–126)
ALT: 26 U/L (ref 14–54)
AST: 27 U/L (ref 15–41)
Bilirubin, Direct: 0.1 mg/dL (ref 0.1–0.5)
Indirect Bilirubin: 0.3 mg/dL (ref 0.3–0.9)
TOTAL PROTEIN: 6.8 g/dL (ref 6.5–8.1)
Total Bilirubin: 0.4 mg/dL (ref 0.3–1.2)

## 2016-10-30 LAB — RETICULOCYTES
RBC.: 2.65 MIL/uL — ABNORMAL LOW (ref 3.87–5.11)
Retic Count, Absolute: 140.5 10*3/uL (ref 19.0–186.0)
Retic Ct Pct: 5.3 % — ABNORMAL HIGH (ref 0.4–3.1)

## 2016-10-30 LAB — FOLATE: Folate: 11.7 ng/mL (ref >5.9)

## 2016-10-30 LAB — ABO/RH: ABO/RH(D): A POS

## 2016-10-30 LAB — VITAMIN B12: Vitamin B-12: 571 pg/mL (ref 180–914)

## 2016-10-30 LAB — PREGNANCY, URINE: Preg Test, Ur: NEGATIVE

## 2016-10-30 LAB — LIPASE, BLOOD: Lipase: 36 U/L (ref 11–51)

## 2016-10-30 LAB — LACTIC ACID, PLASMA: Lactic Acid, Venous: 0.8 mmol/L (ref 0.5–1.9)

## 2016-10-30 LAB — FERRITIN: FERRITIN: 399 ng/mL — AB (ref 11–307)

## 2016-10-30 MED ORDER — HYDROMORPHONE HCL 2 MG/ML IJ SOLN
1.0000 mg | INTRAMUSCULAR | Status: DC | PRN
  Administered 2016-10-30 – 2016-10-31 (×3): 1 mg via INTRAVENOUS
  Filled 2016-10-30 (×4): qty 1

## 2016-10-30 MED ORDER — ACETAMINOPHEN 325 MG PO TABS
650.0000 mg | ORAL_TABLET | Freq: Four times a day (QID) | ORAL | Status: DC | PRN
  Filled 2016-10-30: qty 2

## 2016-10-30 MED ORDER — SENNA 8.6 MG PO TABS
2.0000 | ORAL_TABLET | Freq: Every day | ORAL | Status: DC
  Administered 2016-11-01 – 2016-11-03 (×3): 17.2 mg via ORAL
  Filled 2016-10-30 (×3): qty 2

## 2016-10-30 MED ORDER — INSULIN ASPART 100 UNIT/ML ~~LOC~~ SOLN
0.0000 [IU] | SUBCUTANEOUS | Status: DC
  Administered 2016-10-30 (×2): 2 [IU] via SUBCUTANEOUS
  Administered 2016-10-30 – 2016-10-31 (×4): 1 [IU] via SUBCUTANEOUS

## 2016-10-30 MED ORDER — OXYCODONE HCL 5 MG PO TABS
5.0000 mg | ORAL_TABLET | ORAL | Status: DC | PRN
  Administered 2016-10-30 (×2): 10 mg via ORAL
  Filled 2016-10-30 (×4): qty 2

## 2016-10-30 MED ORDER — FENOFIBRATE 160 MG PO TABS
160.0000 mg | ORAL_TABLET | Freq: Every day | ORAL | Status: DC
  Administered 2016-10-30 – 2016-11-04 (×6): 160 mg via ORAL
  Filled 2016-10-30 (×6): qty 1

## 2016-10-30 MED ORDER — ALPRAZOLAM 0.25 MG PO TABS: 0.2500 mg | ORAL_TABLET | Freq: Three times a day (TID) | ORAL | Status: DC | PRN

## 2016-10-30 MED ORDER — POLYETHYLENE GLYCOL 3350 17 G PO PACK
17.0000 g | PACK | Freq: Every day | ORAL | Status: DC
  Administered 2016-10-30 – 2016-11-04 (×5): 17 g via ORAL
  Filled 2016-10-30 (×5): qty 1

## 2016-10-30 MED ORDER — GEMFIBROZIL 600 MG PO TABS
600.0000 mg | ORAL_TABLET | Freq: Two times a day (BID) | ORAL | Status: DC
  Administered 2016-10-30 – 2016-10-31 (×3): 600 mg via ORAL
  Filled 2016-10-30 (×3): qty 1

## 2016-10-30 MED ORDER — ALBUTEROL SULFATE (2.5 MG/3ML) 0.083% IN NEBU: 2.5000 mg | INHALATION_SOLUTION | RESPIRATORY_TRACT | Status: DC | PRN

## 2016-10-30 MED ORDER — HYDROMORPHONE HCL 2 MG/ML IJ SOLN: 1.0000 mg | Freq: Four times a day (QID) | INTRAMUSCULAR | Status: DC | PRN

## 2016-10-30 MED ORDER — ONDANSETRON HCL 4 MG/2ML IJ SOLN: 4.0000 mg | Freq: Four times a day (QID) | INTRAMUSCULAR | Status: DC | PRN

## 2016-10-30 MED ORDER — HYDROMORPHONE HCL 2 MG/ML IJ SOLN
1.0000 mg | INTRAMUSCULAR | Status: DC | PRN
  Administered 2016-10-30 (×2): 1 mg via INTRAVENOUS
  Filled 2016-10-30 (×2): qty 1

## 2016-10-30 MED ORDER — ONDANSETRON HCL 4 MG PO TABS: 4.0000 mg | ORAL_TABLET | Freq: Four times a day (QID) | ORAL | Status: DC | PRN

## 2016-10-30 MED ORDER — SODIUM CHLORIDE 0.9 % IV SOLN
INTRAVENOUS | Status: DC
  Administered 2016-10-30 (×2): via INTRAVENOUS

## 2016-10-30 NOTE — Progress Notes (Addendum)
PROGRESS NOTE        PATIENT DETAILS Name: Melissa Beasley Needs Age: 36 y.o. Sex: female Date of Birth: Mar 13, 1981 Admit Date: 10/30/2016 Admitting Physician Eduard ClosArshad N Kakrakandy, MD WGN:[id number]:COLLINS, DANA, DO  Brief Narrative: Patient is a 36 y.o. female with recent hospitalization from 10/12/16-10/24/16 with acute pancreatitis due to hypertriglyceridemia, she was managed conservatively and subsequently discharged home. Post discharge, she was doing well for her day or so, following which she started having worsening abdominal pain. She subsequently was evaluated at [facility] emergency room, where she was diagnosed with a large pseudocyst causing mass effect on the stomach. She was subsequently transferred to [facility] for further evaluation and treatment. See below for further details  Subjective: Very anxious-tearful-scared about procedures that might be coming up. Abdominal pain is currently controlled but still present-located diffusely around the periumbilical area. No chest pain or shortness of breath.  Assessment/Plan: Evolving pseudocyst with mass effect on the stomach: Clearly likely due to recent pancreatitis. GI consulted-currently on clear liquids and other supportive measures.  Recent pancreatitis due to hypertriglyceridemia: She continues to have some abdominal pain-CT of the abdomen/pelvis done at [facility] showed no signs of pancreatitis or necrosis. Lipase levels are within normal limits. Continue supportive measures, slowly advance diet. Gastroenterology following.  Hypertriglyceridemia: Triglycerides most recent admission was more than 5000, currently down to 315. Continue Lopid and TriCor-LFTs appear stable.  Anemia: Probably related to prolonged acute illness-although gives a history of black stools-no overt GI bleeding evident. Gastroenterology following-for now would trend CBC and follow stools.  Type 2 diabetes: CBGs appear  stable-with SSI, resume oral hypoglycemic agents on discharge. Check A1c.  Morbid obesity: We'll continue counseling over the next few days.  Anxiety: Very tearful-anxious this morning-start as needed Xanax. Reassurance provided.  DVT Prophylaxis: SCD's-for now-may need to initiate pharmacological prophylaxis if hemoglobin remains stable without any evidence of overt bleeding.  Code Status: Full code   Family Communication: None at bedside  Disposition Plan: Remain inpatient-Will require several more days of hospitalization prior to consideration of discharge  Antimicrobial agents: Anti-infectives    None      Procedures: None  CONSULTS:  GI  Time spent: 25- minutes-Greater than 50% of this time was spent in counseling, explanation of diagnosis, planning of further management, and coordination of care.  MEDICATIONS: Scheduled Meds: . fenofibrate  160 mg Oral Daily  . gemfibrozil  600 mg Oral BID AC  . insulin aspart  0-9 Units Subcutaneous Q4H  . polyethylene glycol  17 g Oral Daily  . senna  2 tablet Oral QHS   Continuous Infusions: . sodium chloride 125 mL/hr at 10/30/16 0425   PRN Meds:.acetaminophen, albuterol, ALPRAZolam, HYDROmorphone (DILAUDID) injection, ondansetron **OR** ondansetron (ZOFRAN) IV, oxyCODONE   PHYSICAL EXAM: Vital signs: Vitals:   10/30/16 0350 10/30/16 0541  BP: 117/73 128/75  Pulse: (!) 105 89  Resp: 20 17  Temp: 98.7 F (37.1 C) 98.2 F (36.8 C)  TempSrc: Oral Oral  SpO2: 99% 97%  Weight: 95.4 kg (210 lb 4.8 oz)   Height: 5\' 6"  (1.676 m)    Filed Weights   10/30/16 0350  Weight: 95.4 kg (210 lb 4.8 oz)   Body mass index is 33.94 kg/m.   General appearance :Awake, alert, not in any distress. Speech Clear. Eyes:, pupils equally reactive to light and accomodation,no scleral  icterus. HEENT: Atraumatic and Normocephalic Neck: supple, no JVD. No cervical lymphadenopathy.  Resp:Good air entry bilaterally, no added sounds    CVS: S1 S2 regular, no murmurs.  GI: Bowel sounds present, mildly but diffusely tender in the periumbilical area, no peritoneal signs. Extremities: B/L Lower Ext shows no edema, both legs are warm to touch Neurology:  speech clear,Non focal, sensation is grossly intact. Psychiatric: Normal judgment and insight. Alert and oriented x 3. Normal mood. Musculoskeletal:No digital cyanosis Skin:No Rash, warm and dry Wounds:N/A  I have personally reviewed following labs and imaging studies  LABORATORY DATA: CBC:  Recent Labs Lab 10/30/16 0430  WBC 10.9*  NEUTROABS 7.0  HGB 7.6*  HCT 24.5*  MCV 88.4  PLT 524*    Basic Metabolic Panel:  Recent Labs Lab 10/24/16 0500 10/30/16 0430  NA 138 138  K 3.5 4.4  CL 97* 102  CO2 31 26  GLUCOSE 180* 181*  BUN 6 15  CREATININE 0.65 1.23*  CALCIUM 8.1* 8.9    GFR: Estimated Creatinine Clearance: 74.3 mL/min (by C-G formula based on SCr of 1.23 mg/dL (H)).  Liver Function Tests:  Recent Labs Lab 10/30/16 0430  AST 27  ALT 26  ALKPHOS 49  BILITOT 0.4  PROT 6.8  ALBUMIN 2.6*    Recent Labs Lab 10/30/16 0430  LIPASE 36   No results for input(s): AMMONIA in the last 168 hours.  Coagulation Profile: No results for input(s): INR, PROTIME in the last 168 hours.  Cardiac Enzymes: No results for input(s): CKTOTAL, CKMB, CKMBINDEX, TROPONINI in the last 168 hours.  BNP (last 3 results) No results for input(s): PROBNP in the last 8760 hours.  HbA1C: No results for input(s): HGBA1C in the last 72 hours.  CBG:  Recent Labs Lab 10/23/16 2133 10/24/16 0756 10/24/16 1136 10/30/16 0753 10/30/16 1109  GLUCAP 177* 159* 173* 170* 145*    Lipid Profile:  Recent Labs  10/30/16 0430  TRIG 315*    Thyroid Function Tests: No results for input(s): TSH, T4TOTAL, FREET4, T3FREE, THYROIDAB in the last 72 hours.  Anemia Panel:  Recent Labs  10/30/16 0857  VITAMINB12 571  FOLATE 11.7  FERRITIN 399*  TIBC 305   IRON 25*  RETICCTPCT 5.3*    Urine analysis:    Component Value Date/Time   COLORURINE YELLOW 10/18/2016 1745   APPEARANCEUR HAZY (A) 10/18/2016 1745   LABSPEC 1.012 10/18/2016 1745   PHURINE 6.0 10/18/2016 1745   GLUCOSEU 50 (A) 10/18/2016 1745   HGBUR LARGE (A) 10/18/2016 1745   BILIRUBINUR NEGATIVE 10/18/2016 1745   KETONESUR NEGATIVE 10/18/2016 1745   PROTEINUR 30 (A) 10/18/2016 1745   NITRITE NEGATIVE 10/18/2016 1745   LEUKOCYTESUR NEGATIVE 10/18/2016 1745    Sepsis Labs: Lactic Acid, Venous    Component Value Date/Time   LATICACIDVEN 0.8 10/30/2016 0430    MICROBIOLOGY: No results found for this or any previous visit (from the past 240 hour(s)).  RADIOLOGY STUDIES/RESULTS: Ct Chest W Contrast  Result Date: 10/18/2016 CLINICAL DATA:  Concern for sepsis in patient with pancreatitis. Further evaluation requested. Initial encounter. EXAM: CT CHEST, ABDOMEN, AND PELVIS WITH CONTRAST TECHNIQUE: Multidetector CT imaging of the chest, abdomen and pelvis was performed following the standard protocol during bolus administration of intravenous contrast. CONTRAST:  100 mL ISOVUE-300 IOPAMIDOL (ISOVUE-300) INJECTION 61% COMPARISON:  CT of the abdomen and pelvis performed 10/11/2016, and chest radiograph performed 10/13/2016 FINDINGS: CT CHEST FINDINGS Cardiovascular: The heart is normal in size. Scattered coronary artery calcifications are seen.  A right IJ line is noted ending about the distal SVC. The thoracic aorta is grossly unremarkable. The great vessels are grossly unremarkable. Mediastinum/Nodes: The mediastinum is grossly unremarkable in appearance. No mediastinal lymphadenopathy is seen. No pericardial effusion is identified. The visualized portions of the thyroid gland are unremarkable. No axillary lymphadenopathy is seen. A small nodule at the right breast is grossly stable from 2008 and likely reflects normal fibroglandular tissue. Lungs/Pleura: Small left and trace right  pleural effusions are noted. Bibasilar airspace opacities may reflect atelectasis or possibly pneumonia. No pneumothorax is seen. No dominant mass is identified. Musculoskeletal: No acute osseous abnormalities are identified. The visualized musculature is unremarkable in appearance. CT ABDOMEN PELVIS FINDINGS Hepatobiliary: There is diffuse fatty infiltration within the liver. The gallbladder is partially filled with contrast. The common bile duct is not well characterized. Pancreas: There is diffuse soft tissue inflammation about the pancreas, with associated fluid tracking about the stomach, small bowel loops, liver, spleen, and along both paracolic gutters into the pelvis. This is compatible with relatively severe acute pancreatitis. No well defined pseudocyst is seen. There is no definite evidence of devascularization at this time. Spleen: The spleen is grossly unremarkable in appearance. Adrenals/Urinary Tract: The adrenal glands are unremarkable in appearance. The kidneys are within normal limits. There is no evidence of hydronephrosis. No renal or ureteral stones are identified. Nonspecific perinephric stranding is noted bilaterally. Stomach/Bowel: The stomach is unremarkable in appearance. The small bowel is within normal limits. The appendix is normal in caliber, without evidence of appendicitis. The colon is unremarkable in appearance. Vascular/Lymphatic: Minimal calcification is seen along the abdominal aorta. No retroperitoneal or pelvic sidewall lymphadenopathy is seen. Reproductive: The bladder is mildly distended and grossly unremarkable. The uterus is grossly unremarkable. The ovaries are relatively symmetric. No suspicious adnexal masses are seen. Other: Diffuse soft tissue edema is seen along the abdominal and pelvic wall, likely reflecting anasarca. A small anterior abdominal wall hernia is seen just superior to the umbilicus, containing only fat. Musculoskeletal: No acute osseous abnormalities  are identified. Vacuum phenomenon is noted at L5-S1. The visualized musculature is unremarkable in appearance. IMPRESSION: 1. Acute pancreatitis, with diffuse soft tissue inflammation about the pancreas, and small to moderate volume ascites within the abdomen and pelvis. No well defined pseudocyst seen. No definite evidence of devascularization at this time. 2. Small left and trace right pleural effusions. Bibasilar airspace opacities may reflect atelectasis or possibly pneumonia. 3. Diffuse soft tissue edema along the abdominal and pelvic wall, likely reflecting anasarca. 4. Scattered coronary artery calcifications seen. 5. Diffuse fatty infiltration within the liver. 6. Small anterior abdominal wall hernia just superior to the umbilicus, containing only fat. Electronically Signed   By: Roanna Raider M.Beasley.   On: 10/18/2016 22:29   Ct Abdomen Pelvis W Contrast  Result Date: 10/18/2016 CLINICAL DATA:  Concern for sepsis in patient with pancreatitis. Further evaluation requested. Initial encounter. EXAM: CT CHEST, ABDOMEN, AND PELVIS WITH CONTRAST TECHNIQUE: Multidetector CT imaging of the chest, abdomen and pelvis was performed following the standard protocol during bolus administration of intravenous contrast. CONTRAST:  100 mL ISOVUE-300 IOPAMIDOL (ISOVUE-300) INJECTION 61% COMPARISON:  CT of the abdomen and pelvis performed 10/11/2016, and chest radiograph performed 10/13/2016 FINDINGS: CT CHEST FINDINGS Cardiovascular: The heart is normal in size. Scattered coronary artery calcifications are seen. A right IJ line is noted ending about the distal SVC. The thoracic aorta is grossly unremarkable. The great vessels are grossly unremarkable. Mediastinum/Nodes: The mediastinum is grossly unremarkable in  appearance. No mediastinal lymphadenopathy is seen. No pericardial effusion is identified. The visualized portions of the thyroid gland are unremarkable. No axillary lymphadenopathy is seen. A small nodule at the  right breast is grossly stable from 2008 and likely reflects normal fibroglandular tissue. Lungs/Pleura: Small left and trace right pleural effusions are noted. Bibasilar airspace opacities may reflect atelectasis or possibly pneumonia. No pneumothorax is seen. No dominant mass is identified. Musculoskeletal: No acute osseous abnormalities are identified. The visualized musculature is unremarkable in appearance. CT ABDOMEN PELVIS FINDINGS Hepatobiliary: There is diffuse fatty infiltration within the liver. The gallbladder is partially filled with contrast. The common bile duct is not well characterized. Pancreas: There is diffuse soft tissue inflammation about the pancreas, with associated fluid tracking about the stomach, small bowel loops, liver, spleen, and along both paracolic gutters into the pelvis. This is compatible with relatively severe acute pancreatitis. No well defined pseudocyst is seen. There is no definite evidence of devascularization at this time. Spleen: The spleen is grossly unremarkable in appearance. Adrenals/Urinary Tract: The adrenal glands are unremarkable in appearance. The kidneys are within normal limits. There is no evidence of hydronephrosis. No renal or ureteral stones are identified. Nonspecific perinephric stranding is noted bilaterally. Stomach/Bowel: The stomach is unremarkable in appearance. The small bowel is within normal limits. The appendix is normal in caliber, without evidence of appendicitis. The colon is unremarkable in appearance. Vascular/Lymphatic: Minimal calcification is seen along the abdominal aorta. No retroperitoneal or pelvic sidewall lymphadenopathy is seen. Reproductive: The bladder is mildly distended and grossly unremarkable. The uterus is grossly unremarkable. The ovaries are relatively symmetric. No suspicious adnexal masses are seen. Other: Diffuse soft tissue edema is seen along the abdominal and pelvic wall, likely reflecting anasarca. A small anterior  abdominal wall hernia is seen just superior to the umbilicus, containing only fat. Musculoskeletal: No acute osseous abnormalities are identified. Vacuum phenomenon is noted at L5-S1. The visualized musculature is unremarkable in appearance. IMPRESSION: 1. Acute pancreatitis, with diffuse soft tissue inflammation about the pancreas, and small to moderate volume ascites within the abdomen and pelvis. No well defined pseudocyst seen. No definite evidence of devascularization at this time. 2. Small left and trace right pleural effusions. Bibasilar airspace opacities may reflect atelectasis or possibly pneumonia. 3. Diffuse soft tissue edema along the abdominal and pelvic wall, likely reflecting anasarca. 4. Scattered coronary artery calcifications seen. 5. Diffuse fatty infiltration within the liver. 6. Small anterior abdominal wall hernia just superior to the umbilicus, containing only fat. Electronically Signed   By: Roanna Raider M.Beasley.   On: 10/18/2016 22:29   Dg Chest Port 1 View  Result Date: 10/23/2016 CLINICAL DATA:  Shortness of Breath EXAM: PORTABLE CHEST 1 VIEW COMPARISON:  Chest radiograph October 13, 2016 and chest CT October 18, 2016 FINDINGS: There is persistent consolidation in the left base with small left pleural effusion. Lungs elsewhere clear. Heart is upper normal in size with pulmonary vascularity within normal limits. Central catheter tip is at the cavoatrial junction. No pneumothorax. No adenopathy. No bone lesions. IMPRESSION: Persistent left base consolidation with small left pleural effusion. Lungs elsewhere clear. Stable cardiac silhouette. No pneumothorax. Electronically Signed   By: Bretta Bang III M.Beasley.   On: 10/23/2016 10:00   Dg Chest Port 1 View  Result Date: 10/13/2016 CLINICAL DATA:  Shortness of Breath EXAM: PORTABLE CHEST 1 VIEW COMPARISON:  10/12/2016 FINDINGS: Very low lung volumes with bilateral airspace opacities. Heart is borderline in size. No definite effusions.  Right central  line and NG tube are unchanged. IMPRESSION: Very low lung volumes with diffuse bilateral airspace disease, worsened since prior study. Electronically Signed   By: Charlett Nose M.Beasley.   On: 10/13/2016 07:38   Dg Chest Port 1 View  Result Date: 10/12/2016 CLINICAL DATA:  Acute respiratory distress EXAM: PORTABLE CHEST 1 VIEW COMPARISON:  10/12/2016 FINDINGS: Right jugular central line and nasogastric catheter are noted in satisfactory position. Cardiac shadow is stable. The overall inspiratory effort is poor with persistent left basilar atelectasis. No new focal abnormality is seen. IMPRESSION: No change from the prior exam. Electronically Signed   By: Alcide Clever M.Beasley.   On: 10/12/2016 18:32   Dg Abd Portable 1v  Result Date: 10/13/2016 CLINICAL DATA:  Patient with ileus.  NG tube placement. EXAM: PORTABLE ABDOMEN - 1 VIEW COMPARISON:  CT abdomen pelvis 10/11/2016. FINDINGS: Enteric tube tip and side-port project over the stomach. Gas is demonstrated within mildly dilated loops of small bowel within the central and left hemiabdomen. At lumbar spine degenerative changes. IMPRESSION: Enteric tube tip and side-port project over the stomach. Suspect mild ileus. Electronically Signed   By: Annia Belt M.Beasley.   On: 10/13/2016 11:28     LOS: 0 days   Jeoffrey Massed, MD  [facility] Pager:[phone]  If 7PM-7AM, please contact night-coverage [email or web address] Password [facility] 10/30/2016, 11:35 AM

## 2016-10-30 NOTE — Consult Note (Signed)
[facility] Consult: 8:55 AM 10/30/2016  LOS: 0 days    Referring Provider: Dr Jerral RalphGhimire  Primary Care Physician:  Irena ReichmannOLLINS, DANA, DO in [place] Primary Gastroenterologist:  Gentry FitzUnassigned.      Reason for Consultation:  Pancreatitis and pseudocyst.    HPI: Melissa Beasley is a 36 y.o. female.  PMH DM 2 on oral agents. Obesity. Hypertriglyceridemia.  Status post umbilical hernia repair.  Vaginal delivery x 2 (as of 10/2016 her children are 7 and 14)  Diabetes and hypertriglyceridemia diagnosed in November 2017. Started on Glucophage, TriCor, Lopid. She recalls that her triglycerides were 9000.  [facility] 10/24/2016 with acute pancreatitis attributed to hypertriglyceridemia.  Triglycerides measured greater than 5500.  Lipase was 450.  LFTs normal Ultrasound performed at [facility] showed fatty liver but no dilated bile ducts.  CT abdomen pelvis showed fatty liver.  Acute pancreatitis, no definite pseudocyst but small to moderate abdominopelvic ascites.  Small, fat-containing, abdominal wall hernia. Over the course of her hospitalization lipase normalized. Final triglyceride level 394.  She was anemic with hemoglobin 8.6 at discharge. MCV normal.  She was treated for sepsis/aspiration pneumonia with Zosyn>> Augmentin.   Since discharge the pain has resolved but she c/o nausea. She can not eat much because of the nausea, though she has not vomited. She became increasingly weak and returned to [facility] yesterday. WBCs elevated at 14.8. Slight renal insufficiency. Normal LFTs. Lipase 216. CT abdomen/pelvis shows developing pseudocyst, 11.7 x 7.2 x 10.7 cm, slightly displacing the stomach.  No pancreatic necrosis. Patent splenic and portal veins. Hepatic steatosis.  Left pericolic  gutter fluid increased compared with previous scan.    Initially at home she was having diarrhea but this resolved, however she reports black stools. FOBT on 2/12 at PMD office and again in ED on 2/13 were both negative.    Pt denies ETOH.  Family history of hypertriglyceridemia but not pancreatitis.     Past Medical History:  Diagnosis Date  . Diabetes mellitus without complication (HCC)    TYPE 2  . Hypertriglyceridemia   . Pancreatitis     Past Surgical History:  Procedure Laterality Date  . HERNIA REPAIR      Prior to Admission medications   Medication Sig Start Date End Date Taking? Authorizing Provider  fenofibrate (TRICOR) 145 MG tablet Take 1 tablet (145 mg total) by mouth daily. 10/24/16  Yes Costin Otelia SergeantM Gherghe, MD  furosemide (LASIX) 20 MG tablet Take 1 tablet (20 mg total) by mouth daily. For 5 days 10/24/16 10/30/16 Yes Costin Otelia SergeantM Gherghe, MD  gemfibrozil (LOPID) 600 MG tablet Take 1 tablet (600 mg total) by mouth 2 (two) times daily before a meal. 10/24/16  Yes Costin Otelia SergeantM Gherghe, MD  oxyCODONE-acetaminophen (PERCOCET/ROXICET) 5-325 MG tablet Take 1-2 tablets by mouth every 4 (four) hours as needed for severe pain. 10/24/16  Yes Costin Otelia SergeantM Gherghe, MD  pantoprazole (PROTONIX) 20 MG tablet Take 20 mg by mouth daily. 10/28/16  Yes Historical Provider, MD  promethazine (PHENERGAN) 12.5 MG tablet Take 12.5  mg by mouth every 6 (six) hours as needed for nausea/vomiting. 10/28/16  Yes Historical Provider, MD  amoxicillin-clavulanate (AUGMENTIN) 875-125 MG tablet Take 1 tablet by mouth 2 (two) times daily. Patient not taking: Reported on 10/30/2016 10/24/16   Leatha Gilding, MD  glipiZIDE (GLUCOTROL XL) 2.5 MG 24 hr tablet Take 2.5 mg by mouth daily.    Historical Provider, MD  potassium chloride (K-DUR,KLOR-CON) 10 MEQ tablet Take 10 mEq by mouth 2 (two) times daily.    Historical Provider, MD    Scheduled Meds: . fenofibrate  160 mg Oral Daily  . gemfibrozil  600 mg Oral BID AC  . insulin  aspart  0-9 Units Subcutaneous Q4H   Infusions: . sodium chloride 125 mL/hr at 10/30/16 0425   PRN Meds: HYDROmorphone (DILAUDID) injection, ondansetron **OR** ondansetron (ZOFRAN) IV   Allergies as of 10/29/2016  . (No Known Allergies)    Family History  Problem Relation Age of Onset  . Pancreatitis Neg Hx     Social History   Social History  . Marital status: Single    Spouse name: N/A  . Number of children: N/A  . Years of education: N/A   Occupational History  . Not on file.   Social History Main Topics  . Smoking status: Former Games developer  . Smokeless tobacco: Never Used  . Alcohol use No  . Drug use: No  . Sexual activity: Not on file   Other Topics Concern  . Not on file   Social History Narrative  . No narrative on file    REVIEW OF SYSTEMS: Constitutional:  Weakness.  ENT:  No nose bleeds Pulm:  No cough, no shortness of breath, no pleuritic pain.  CV:  No palpitations, no LE edema. No chest pain  GU:  No hematuria, no frequency, no oliguria  GYN :  Patient had gone about 3 months without having her period, however within the last week she had a normal period, this was not heavy GI:  Per HPI Heme:  No unusual bleeding or bruising.   Transfusions:  none Neuro:  No headaches, no peripheral tingling or numbness Derm:  No itching, no rash or sores.  Endocrine:  No sweats or chills.  No polyuria or dysuria Immunization:  Did not query Travel:  None beyond local counties in last few months.    PHYSICAL EXAM: Vital signs in last 24 hours: Vitals:   10/30/16 0350 10/30/16 0541  BP: 117/73 128/75  Pulse: (!) 105 89  Resp: 20 17  Temp: 98.7 F (37.1 C) 98.2 F (36.8 C)   Wt Readings from Last 3 Encounters:  10/30/16 95.4 kg (210 lb 4.8 oz)  10/24/16 103.4 kg (227 lb 14.4 oz)    General: Obese, comfortable, does not look ill. WF  Head:  No asymmetry or swelling. No signs of head,.  Eyes:  No scleral icterus, no conjunctival pallor.  Ears:  Not  hard of hearing.  Nose:  No congestion or discharge  Mouth:  Moist, clear oropharynx. Tongue midline. Good dentition.  Neck:  No JVD, no thyromegaly, no masses.  Lungs:  Lungs clear bilaterally. No labored breathing, no cough.  Heart: RRR. No MRG. S1, S2 present.  Abdomen:  Obese, soft, not distended. Moderate tenderness in the right and left upper quadrants. No guarding or rebound. Bowel sounds active. No tinkling or tympanitic bowel sounds..   Rectal: Deferred. In the last couple of days fecal occult blood testing negative x 2   Musc/Skeltl: No  joint erythema, swelling or gross deformities.  Extremities:  CCE.  Neurologic:  Alert. Oriented times 3. Moves all 4 limbs, no tremor. No gross deficits.  Skin:  No rash, no sores, no telangiectasia.  Tattoos:  1 large, professional appearing tattoo on the upper back.  Nodes:  No cervical adenopathy.   Psych:  Pleasant, cooperative, got teary-eyed at one point.    Intake/Output from previous day: No intake/output data recorded. Intake/Output this shift: No intake/output data recorded.  LAB RESULTS:  Recent Labs  10/30/16 0430  WBC 10.9*  HGB 7.6*  HCT 24.5*  PLT 524*   BMET Lab Results  Component Value Date   NA 138 10/30/2016   NA 138 10/24/2016   NA 138 10/22/2016   K 4.4 10/30/2016   K 3.5 10/24/2016   K 3.5 10/22/2016   CL 102 10/30/2016   CL 97 (L) 10/24/2016   CL 97 (L) 10/22/2016   CO2 26 10/30/2016   CO2 31 10/24/2016   CO2 30 10/22/2016   GLUCOSE 181 (H) 10/30/2016   GLUCOSE 180 (H) 10/24/2016   GLUCOSE 216 (H) 10/22/2016   BUN 15 10/30/2016   BUN 6 10/24/2016   BUN <5 (L) 10/22/2016   CREATININE 1.23 (H) 10/30/2016   CREATININE 0.65 10/24/2016   CREATININE 0.70 10/22/2016   CALCIUM 8.9 10/30/2016   CALCIUM 8.1 (L) 10/24/2016   CALCIUM 8.2 (L) 10/22/2016   LFT  Recent Labs  10/30/16 0430  PROT 6.8  ALBUMIN 2.6*  AST 27  ALT 26  ALKPHOS 49  BILITOT 0.4  BILIDIR 0.1  IBILI 0.3   PT/INR Lab  Results  Component Value Date   INR 1.13 10/12/2016   Hepatitis Panel No results for input(s): HEPBSAG, HCVAB, HEPAIGM, HEPBIGM in the last 72 hours. C-Diff No components found for: CDIFF Lipase     Component Value Date/Time   LIPASE 36 10/30/2016 0430    Drugs of Abuse     Component Value Date/Time   LABOPIA POSITIVE (A) 10/12/2016 1745   COCAINSCRNUR NONE DETECTED 10/12/2016 1745   LABBENZ NONE DETECTED 10/12/2016 1745   AMPHETMU NONE DETECTED 10/12/2016 1745   THCU NONE DETECTED 10/12/2016 1745   LABBARB NONE DETECTED 10/12/2016 1745     RADIOLOGY STUDIES: No results found.  ENDOSCOPIC STUDIES: Has never undergone upper endoscopy, colonoscopy etc. has never seen a GI doctor.  IMPRESSION:   *  Recent acute pancreatitis. Now with resolving pancreatitis but evolving pseudocyst. The pseudocyst is causing gastric mass effect, and hence nausea.  *  Hypertriglyceridemia. This is assumed to be the cause of her pancreatitis.  *  Normocytic anemia.  The patient has been having black stools, these have tested FOBT negative twice in the last couple of days.   PLAN:     *  Supportive care. The pseudocyst is to "young" for drainage.  Iron/anemia studies are pending.  Clears as tolerated.         Jennye Moccasin  10/30/2016, 8:55 AM Pager: [phone]

## 2016-10-30 NOTE — H&P (Addendum)
History and Physical    SOFIJA ANTWI UEA:[id number] DOB: 20-Mar-1981 DOA: 10/30/2016  PCP: Irena Reichmann, DO  Patient coming from: Patient was transferred from [facility].  Chief Complaint: Abdominal pain and weakness.  HPI: Melissa Beasley is a 36 y.o. female with history of diabetes mellitus type 2 recently admitted for acute pancreatitis in the setting of severe triglyceridemia presents to the ER at [facility] with weakness dizziness and persistent abdominal discomfort with diarrhea. Patient states since discharge she has been hesitant to eat well for the fear of abdominal pain. She has been taking liquid diet but has been having diarrhea and the pain is been persistent mostly in the lower abdominal quadrants. Denies any vomiting. Denies any fever or chills. CT of the abdomen and pelvis done in the ER shows development of pseudocyst with persistent peripancreatic inflammation with pseudocyst displacing the stomach. Other labs include chronic anemia which has been present during last admission. Stool for occult blood was negative. Patient was given fluid bolus and transferred to [facility] for further care due to lack of GI coverage at [facility] and also at patient's request. Lipase and LFTs were normal. Patient otherwise denies any chest pain or shortness of breath. Chest x-ray shows left-sided opacity but no signs of infection.   ED Course: Patient was a direct admit.  Review of Systems: As per HPI, rest all negative.   Past Medical History:  Diagnosis Date  . Diabetes mellitus without complication (HCC)    TYPE 2  . Hypertriglyceridemia   . Pancreatitis     Past Surgical History:  Procedure Laterality Date  . HERNIA REPAIR       reports that she has quit smoking. She has never used smokeless tobacco. She reports that she does not drink alcohol or use drugs.  No Known Allergies  Family History  Problem Relation Age of Onset  . Pancreatitis Neg  Hx     Prior to Admission medications   Medication Sig Start Date End Date Taking? Authorizing Provider  amoxicillin-clavulanate (AUGMENTIN) 875-125 MG tablet Take 1 tablet by mouth 2 (two) times daily. 10/24/16   Costin Otelia Sergeant, MD  fenofibrate (TRICOR) 145 MG tablet Take 1 tablet (145 mg total) by mouth daily. 10/24/16   Costin Otelia Sergeant, MD  furosemide (LASIX) 20 MG tablet Take 1 tablet (20 mg total) by mouth daily. For 5 days 10/24/16 10/29/16  Leatha Gilding, MD  gemfibrozil (LOPID) 600 MG tablet Take 1 tablet (600 mg total) by mouth 2 (two) times daily before a meal. 10/24/16   Costin Otelia Sergeant, MD  glipiZIDE (GLUCOTROL XL) 2.5 MG 24 hr tablet Take 2.5 mg by mouth daily.    Historical Provider, MD  oxyCODONE-acetaminophen (PERCOCET/ROXICET) 5-325 MG tablet Take 1-2 tablets by mouth every 4 (four) hours as needed for severe pain. 10/24/16   Costin Otelia Sergeant, MD  potassium chloride (K-DUR) 10 MEQ tablet Take 1 tablet (10 mEq total) by mouth daily. 10/24/16 10/29/16  Costin Otelia Sergeant, MD    Physical Exam: Vitals:   10/30/16 0350 10/30/16 0541  BP: 117/73 128/75  Pulse: (!) 105 89  Resp: 20 17  Temp: 98.7 F (37.1 C) 98.2 F (36.8 C)  TempSrc: Oral Oral  SpO2: 99% 97%  Weight: 95.4 kg (210 lb 4.8 oz)   Height: 5\' 6"  (1.676 m)       Constitutional: Moderately built and nourished. Vitals:   10/30/16 0350 10/30/16 0541  BP: 117/73 128/75  Pulse: (!) 105 89  Resp: 20 17  Temp: 98.7 F (37.1 C) 98.2 F (36.8 C)  TempSrc: Oral Oral  SpO2: 99% 97%  Weight: 95.4 kg (210 lb 4.8 oz)   Height: 5\' 6"  (1.676 m)    Eyes: Anicteric. no pallor. ENMT: No discharge from the ears eyes nose and mouth. Neck: No mass felt. No neck rigidity. Respiratory: No rhonchi or crepitations. Cardiovascular: S1 and S2 heard no murmurs appreciated. Abdomen: Soft nontender bowel sounds present. Musculoskeletal: No edema. No joint effusion. Skin: No rash. Skin appears warm. Neurologic: Alert awake oriented  to time person and place. Moves all extremities. Psychiatric: Appears normal. Normal affect.   Labs on Admission: I have personally reviewed following labs and imaging studies  CBC:  Recent Labs Lab 10/30/16 0430  WBC 10.9*  NEUTROABS 7.0  HGB 7.6*  HCT 24.5*  MCV 88.4  PLT 524*   Basic Metabolic Panel:  Recent Labs Lab 10/24/16 0500 10/30/16 0430  NA 138 138  K 3.5 4.4  CL 97* 102  CO2 31 26  GLUCOSE 180* 181*  BUN 6 15  CREATININE 0.65 1.23*  CALCIUM 8.1* 8.9   GFR: Estimated Creatinine Clearance: 74.3 mL/min (by C-G formula based on SCr of 1.23 mg/dL (H)). Liver Function Tests:  Recent Labs Lab 10/30/16 0430  AST 27  ALT 26  ALKPHOS 49  BILITOT 0.4  PROT 6.8  ALBUMIN 2.6*    Recent Labs Lab 10/30/16 0430  LIPASE 36   No results for input(s): AMMONIA in the last 168 hours. Coagulation Profile: No results for input(s): INR, PROTIME in the last 168 hours. Cardiac Enzymes: No results for input(s): CKTOTAL, CKMB, CKMBINDEX, TROPONINI in the last 168 hours. BNP (last 3 results) No results for input(s): PROBNP in the last 8760 hours. HbA1C: No results for input(s): HGBA1C in the last 72 hours. CBG:  Recent Labs Lab 10/23/16 1144 10/23/16 1622 10/23/16 2133 10/24/16 0756 10/24/16 1136  GLUCAP 175* 238* 177* 159* 173*   Lipid Profile:  Recent Labs  10/30/16 0430  TRIG 315*   Thyroid Function Tests: No results for input(s): TSH, T4TOTAL, FREET4, T3FREE, THYROIDAB in the last 72 hours. Anemia Panel: No results for input(s): VITAMINB12, FOLATE, FERRITIN, TIBC, IRON, RETICCTPCT in the last 72 hours. Urine analysis:    Component Value Date/Time   COLORURINE YELLOW 10/18/2016 1745   APPEARANCEUR HAZY (A) 10/18/2016 1745   LABSPEC 1.012 10/18/2016 1745   PHURINE 6.0 10/18/2016 1745   GLUCOSEU 50 (A) 10/18/2016 1745   HGBUR LARGE (A) 10/18/2016 1745   BILIRUBINUR NEGATIVE 10/18/2016 1745   KETONESUR NEGATIVE 10/18/2016 1745   PROTEINUR  30 (A) 10/18/2016 1745   NITRITE NEGATIVE 10/18/2016 1745   LEUKOCYTESUR NEGATIVE 10/18/2016 1745   Sepsis Labs: @LABRCNTIP (procalcitonin:4,lacticidven:4) )No results found for this or any previous visit (from the past 240 hour(s)).   Radiological Exams on Admission: No results found.   Assessment/Plan Active Problems:   Pancreatitis, acute   Type 2 diabetes mellitus with hyperglycemia (HCC)   Hypertriglyceridemia   Normocytic normochromic anemia   Acute pancreatitis    1. Acute pancreatitis with pseudocyst formation - CT scan done at [facility] last night shows no signs of necrosis. Patient is afebrile. Since patient is still having abdominal discomfort and will keep patient nothing by mouth except medications for now and on IV fluids. Please consult gastroenterologist in a.m. 2. Hypertriglyceridemia - improved at this time. Continue with Lopid and fenofibrate. 3. Diabetes mellitus type 2 - patient  has been placed on sliding scale coverage. 4. Normocytic normochromic anemia with worsening hemoglobin - check anemia panel. Follow CBC. Type and screen has been ordered. There is increased fluid in the abdomen in  the CAT scan. No definite signs of any bleed.   DVT prophylaxis: SCDs. Code Status: Full code.  Family Communication: Discussed with patient.  Disposition Plan: Home.  Consults called: None.  Admission status: Inpatient.    Eduard ClosKAKRAKANDY,Azrael Huss N. MD [facility] Pager [phone].  If 7PM-7AM, please contact night-coverage [email or web address] Password [email or web address]  10/30/2016, 5:59 AM

## 2016-10-31 LAB — GLUCOSE, CAPILLARY
GLUCOSE-CAPILLARY: 146 mg/dL — AB (ref 65–99)
Glucose-Capillary: 123 mg/dL — ABNORMAL HIGH (ref 65–99)
Glucose-Capillary: 129 mg/dL — ABNORMAL HIGH (ref 65–99)
Glucose-Capillary: 138 mg/dL — ABNORMAL HIGH (ref 65–99)
Glucose-Capillary: 145 mg/dL — ABNORMAL HIGH (ref 65–99)

## 2016-10-31 LAB — CBC
HEMATOCRIT: 21.8 % — AB (ref 36.0–46.0)
HEMATOCRIT: 26.8 % — AB (ref 36.0–46.0)
Hemoglobin: 6.7 g/dL — CL (ref 12.0–15.0)
Hemoglobin: 8.4 g/dL — ABNORMAL LOW (ref 12.0–15.0)
MCH: 27.3 pg (ref 26.0–34.0)
MCH: 27.4 pg (ref 26.0–34.0)
MCHC: 30.7 g/dL (ref 30.0–36.0)
MCHC: 31.3 g/dL (ref 30.0–36.0)
MCV: 89 fL (ref 78.0–100.0)
MCV: 87.3 fL (ref 78.0–100.0)
Platelets: 434 10*3/uL — ABNORMAL HIGH (ref 150–400)
Platelets: 460 10*3/uL — ABNORMAL HIGH (ref 150–400)
RBC: 2.45 MIL/uL — ABNORMAL LOW (ref 3.87–5.11)
RBC: 3.07 MIL/uL — ABNORMAL LOW (ref 3.87–5.11)
RDW: 14.6 % (ref 11.5–15.5)
RDW: 14.1 % (ref 11.5–15.5)
WBC: 6.9 10*3/uL (ref 4.0–10.5)
WBC: 6.3 10*3/uL (ref 4.0–10.5)

## 2016-10-31 LAB — COMPREHENSIVE METABOLIC PANEL
ALK PHOS: 41 U/L (ref 38–126)
ALT: 23 U/L (ref 14–54)
ANION GAP: 9 (ref 5–15)
AST: 25 U/L (ref 15–41)
Albumin: 2.3 g/dL — ABNORMAL LOW (ref 3.5–5.0)
BUN: 13 mg/dL (ref 6–20)
CO2: 24 mmol/L (ref 22–32)
CREATININE: 0.93 mg/dL (ref 0.44–1.00)
Calcium: 8.8 mg/dL — ABNORMAL LOW (ref 8.9–10.3)
Chloride: 104 mmol/L (ref 101–111)
Glucose, Bld: 136 mg/dL — ABNORMAL HIGH (ref 65–99)
Potassium: 4.2 mmol/L (ref 3.5–5.1)
SODIUM: 137 mmol/L (ref 135–145)
Total Bilirubin: 0.7 mg/dL (ref 0.3–1.2)
Total Protein: 6 g/dL — ABNORMAL LOW (ref 6.5–8.1)

## 2016-10-31 LAB — HEMOGLOBIN AND HEMATOCRIT, BLOOD
HCT: 25.2 % — ABNORMAL LOW (ref 36.0–46.0)
HEMOGLOBIN: 7.9 g/dL — AB (ref 12.0–15.0)

## 2016-10-31 LAB — PREPARE RBC (CROSSMATCH)

## 2016-10-31 LAB — HEMOGLOBIN A1C
HEMOGLOBIN A1C: 10.3 % — AB (ref 4.8–5.6)
MEAN PLASMA GLUCOSE: 249 mg/dL

## 2016-10-31 LAB — MAGNESIUM: MAGNESIUM: 1.9 mg/dL (ref 1.7–2.4)

## 2016-10-31 MED ORDER — PANTOPRAZOLE SODIUM 40 MG PO TBEC
40.0000 mg | DELAYED_RELEASE_TABLET | Freq: Every day | ORAL | Status: DC
Start: 2016-10-31 — End: 2016-11-04
  Administered 2016-10-31 – 2016-11-04 (×5): 40 mg via ORAL
  Filled 2016-10-31 (×5): qty 1

## 2016-10-31 MED ORDER — OXYCODONE HCL 5 MG PO TABS
5.0000 mg | ORAL_TABLET | Freq: Four times a day (QID) | ORAL | Status: DC | PRN
  Administered 2016-10-31 – 2016-11-01 (×3): 5 mg via ORAL
  Filled 2016-10-31 (×3): qty 1

## 2016-10-31 MED ORDER — OXYCODONE-ACETAMINOPHEN 5-325 MG PO TABS
1.0000 | ORAL_TABLET | Freq: Four times a day (QID) | ORAL | Status: DC | PRN
  Administered 2016-10-31 – 2016-11-04 (×12): 2 via ORAL
  Filled 2016-10-31 (×5): qty 2
  Filled 2016-10-31: qty 1
  Filled 2016-10-31 (×7): qty 2

## 2016-10-31 MED ORDER — HYDROMORPHONE HCL 2 MG/ML IJ SOLN: 1.0000 mg | Freq: Three times a day (TID) | INTRAMUSCULAR | Status: DC | PRN

## 2016-10-31 MED ORDER — SODIUM CHLORIDE 0.9 % IV SOLN: Freq: Once | INTRAVENOUS | Status: DC

## 2016-10-31 MED ORDER — LIVING WELL WITH DIABETES BOOK
Freq: Once | Status: AC
  Administered 2016-10-31: 14:00:00
  Filled 2016-10-31 (×2): qty 1

## 2016-10-31 MED ORDER — ATORVASTATIN CALCIUM 40 MG PO TABS
40.0000 mg | ORAL_TABLET | Freq: Every day | ORAL | Status: DC
  Administered 2016-10-31 – 2016-11-03 (×4): 40 mg via ORAL
  Filled 2016-10-31 (×4): qty 1

## 2016-10-31 NOTE — Progress Notes (Signed)
CRITICAL VALUE ALERT  Critical value received:  HGB 6.7  Date of notification:  10/31/2016  Time of notification:  0600  Critical value read back:Yes.    Nurse who received alert:  [name]  MD notified (1st page):  NP Craige CottaKirby  Time of first page:  0601  MD notified (2nd page):  Time of second page:  Responding MD:  NP Craige CottaKirby  Time MD responded:  [phone]

## 2016-10-31 NOTE — Progress Notes (Addendum)
Inpatient Diabetes Program Recommendations  AACE/ADA: New Consensus Statement on Inpatient Glycemic Control (2015)  Target Ranges:  Prepandial:   less than 140 mg/dL      Peak postprandial:   less than 180 mg/dL (1-2 hours)      Critically ill patients:  140 - 180 mg/dL  Results for Melissa Beasley, Melissa Beasley (MRN [id number]) as of 10/31/2016 12:29  Ref. Range 10/30/2016 07:53 10/30/2016 11:09 10/30/2016 16:57 10/30/2016 20:48 10/30/2016 23:50 10/31/2016 03:37 10/31/2016 12:18  Glucose-Capillary Latest Ref Range: 65 - 99 mg/dL 409170 (H) 811145 (H) 914194 (H) 115 (H) 150 (H) 146 (H) 123 (H)   Results for Melissa Beasley, Melissa Beasley (MRN [id number]) as of 10/31/2016 12:29  Ref. Range 10/30/2016 08:57  Hemoglobin A1C Latest Ref Range: 4.8 - 5.6 % 10.3 (H)   Review of Glycemic Control  Diabetes history: DM2 Outpatient Diabetes medications: Glipizide XL 2.5 mg daily Current orders for Inpatient glycemic control: Novolog 0-9 units Q4H  Inpatient Diabetes Program Recommendations: HgbA1C: A1C 10.3% on 10/30/16 indicating an average glucose of 249 mg/dl over the past 2-3 months. Anticipate A1C is elevated due to recent issue with pancreatitis and hypertriglyceridemia.   NOTE: Since being admitted glucose has ranged from 115-194 mg/dl with minimal Novolog correction insulin.  In reviewing chart noted patient was recently hospitalized from 10/12/16 to 10/24/16 for pancreatitis and hypertriglyceridemia which likely contributed to elevated A1C. Spoke with patient over the phone about diabetes and home regimen for diabetes control. Patient reports that she is followed by PCP for diabetes management and currently she takes Glipizide XL 2.5 mg daily as an outpatient for diabetes control. Patient reports that she was diagnosed with DM in November 2017 during a hospitalization [facility] and was started on Glipizide at that time. Noted in chart review that office note by Dr. Thomasena Edisollins (PCP) on 10/28/16 notes patient is prescribed Glipizide XL 5 mg  daily. Inquired about dosage difference and patient confirms that she is taking Glipizide XL 2.5 mg daily.  Patient states that she checks her glucose before meals and it is usually less than 200 mg/dl and reports that over the past 1 week glucose has ranged from 115-200 mg/dl.  Inquired about hypoglycemia and patient states that she has not experienced any issues with hypoglycemia. Asked if she knew value consider too low or symptoms of hypoglycemia and patient reports that she does not know glucose value or symptoms of hypoglycemia.  Discussed hypoglycemia and proper treatment. Encouraged patient to always check glucose if she experiences any symptoms of hypoglycemia.  Inquired about prior A1C and patient reports that she does not know what an A1C value is. Explained what an A1C is and discussed A1C results (10.3% on 10/30/16) and explained that her current A1C indicates an average glucose of 249 mg/dl over the past 2-3 months. Informed patient that recent issue with pancreatitis and elevated triglycerides likely contributed to elevated A1C. Discussed glucose and A1C goals. Discussed importance of checking CBGs and maintaining good CBG control to prevent long-term and short-term complications.  Encouraged patient to continue checking glucose at home and to notify her PCP if she experiences any issues with hypoglycemia or if glucose is consistently greater than 180 mg/dl.  Patient verbalized understanding of information discussed and she states that she has no further questions at this time related to diabetes. Ordered Living Well with Diabetes booklet.  Thanks, Orlando PennerMarie Neilani Duffee, RN, MSN, CDE Diabetes Coordinator Inpatient Diabetes Program [phone] (Team Pager)

## 2016-10-31 NOTE — Progress Notes (Addendum)
PROGRESS NOTE        PATIENT DETAILS Name: Melissa Beasley Age: 36 y.o. Sex: female Date of Birth: 23-Dec-1980 Admit Date: 10/30/2016 Admitting Physician Eduard Clos, MD NFA:[id number], DANA, DO  Brief Narrative: Patient is a 36 y.o. female with recent hospitalization from 10/12/16-10/24/16 with acute pancreatitis due to hypertriglyceridemia, she was managed conservatively and subsequently discharged home. Post discharge, she was doing well for her day or so, following which she started having worsening abdominal pain. She subsequently was evaluated at [facility] emergency room, where she was diagnosed with a large pseudocyst causing mass effect on the stomach. She was subsequently transferred to [facility] for further evaluation and treatment. See below for further details  Subjective: Abdominal pain is somewhat improved. No longer vomiting. Has not had a bowel movement since admission.   Assessment/Plan: Evolving pseudocyst with mass effect on the stomach: Clearly likely due to recent pancreatitis. GI consulted-recommendations are to continue with supportive measures, and to repeat CT scan of the abdomen in 1-2 months. We will slowly advance diet. Minimize IV narcotics as much as possible.  Recent pancreatitis due to hypertriglyceridemia: She continues to have some abdominal pain-CT of the abdomen/pelvis done at [facility] showed no signs of pancreatitis or necrosis. Lipase levels are within normal limits. Continue supportive measures, slowly advance diet.   Hypertriglyceridemia: Triglycerides most recent admission was more than 5000, currently down to 315. Continue TriCor and statin-LFTs appear stable.  Anemia: Probably related to prolonged acute illness-although gives a history of black stools-no overt GI bleeding evident-hemoglobin drifted down to 6.7-as noted above no stool since admission-I suspect decreasing hemoglobin is due to IV  fluid dilution. Plans are to transfuse one unit of PRBC and follow CBC trend.   Type 2 diabetes: CBGs appear stable-with SSI, resume oral hypoglycemic agents on discharge. A1c 10.3  Morbid obesity: We'll continue counseling over the next few days.  Anxiety: Very tearful-anxious this morning-start as needed Xanax. Reassurance provided.  DVT Prophylaxis: SCD's-for now-may need to initiate pharmacological prophylaxis if hemoglobin remains stable without any evidence of overt bleeding.  Code Status: Full code   Family Communication: None at bedside  Disposition Plan: Remain inpatient-Will require several more days of hospitalization prior to consideration of discharge  Antimicrobial agents: Anti-infectives    None      Procedures: None  CONSULTS:  GI  Time spent: 25- minutes-Greater than 50% of this time was spent in counseling, explanation of diagnosis, planning of further management, and coordination of care.  MEDICATIONS: Scheduled Meds: . sodium chloride   Intravenous Once  . atorvastatin  40 mg Oral q1800  . fenofibrate  160 mg Oral Daily  . insulin aspart  0-9 Units Subcutaneous Q4H  . living well with diabetes book   Does not apply Once  . pantoprazole  40 mg Oral Q1200  . polyethylene glycol  17 g Oral Daily  . senna  2 tablet Oral QHS   Continuous Infusions:  PRN Meds:.albuterol, ALPRAZolam, HYDROmorphone (DILAUDID) injection, ondansetron **OR** ondansetron (ZOFRAN) IV, oxyCODONE-acetaminophen **AND** oxyCODONE   PHYSICAL EXAM: Vital signs: Vitals:   10/31/16 0622 10/31/16 0852 10/31/16 0918 10/31/16 1126  BP: 120/73 125/77 131/82 123/86  Pulse: 90 97 93 89  Resp: 20 15 16 16   Temp: 98.2 F (36.8 C) 97.8 F (36.6 C) 98.5 F (36.9 C) 99 F (37.2 C)  TempSrc: Oral Oral Oral Oral  SpO2: 98% 96% 95% 95%  Weight:      Height:       Filed Weights   10/30/16 0350  Weight: 95.4 kg (210 lb 4.8 oz)   Body mass index is 33.94 kg/m.   General  appearance :Awake, alert, not in any distress. Speech Clear. Eyes:, pupils equally reactive to light and accomodation,no scleral icterus. HEENT: Atraumatic and Normocephalic Neck: supple, no JVD. No cervical lymphadenopathy.  Resp:Good air entry bilaterally, no added sounds  CVS: S1 S2 regular, no murmurs.  GI: Bowel sounds present, minimally tender in the periumbilical area, no peritoneal signs. Extremities: B/L Lower Ext shows no edema, both legs are warm to touch Neurology:  speech clear,Non focal, sensation is grossly intact. Psychiatric: Normal judgment and insight. Alert and oriented x 3. Normal mood. Musculoskeletal:No digital cyanosis Skin:No Rash, warm and dry Wounds:N/A  I have personally reviewed following labs and imaging studies  LABORATORY DATA: CBC:  Recent Labs Lab 10/30/16 0430 10/31/16 0430  WBC 10.9* 6.9  NEUTROABS 7.0  --   HGB 7.6* 6.7*  HCT 24.5* 21.8*  MCV 88.4 89.0  PLT 524* 434*    Basic Metabolic Panel:  Recent Labs Lab 10/30/16 0430 10/31/16 0430  NA 138 137  K 4.4 4.2  CL 102 104  CO2 26 24  GLUCOSE 181* 136*  BUN 15 13  CREATININE 1.23* 0.93  CALCIUM 8.9 8.8*  MG  --  1.9    GFR: Estimated Creatinine Clearance: 98.2 mL/min (by C-G formula based on SCr of 0.93 mg/dL).  Liver Function Tests:  Recent Labs Lab 10/30/16 0430 10/31/16 0430  AST 27 25  ALT 26 23  ALKPHOS 49 41  BILITOT 0.4 0.7  PROT 6.8 6.0*  ALBUMIN 2.6* 2.3*    Recent Labs Lab 10/30/16 0430  LIPASE 36   No results for input(s): AMMONIA in the last 168 hours.  Coagulation Profile: No results for input(s): INR, PROTIME in the last 168 hours.  Cardiac Enzymes: No results for input(s): CKTOTAL, CKMB, CKMBINDEX, TROPONINI in the last 168 hours.  BNP (last 3 results) No results for input(s): PROBNP in the last 8760 hours.  HbA1C:  Recent Labs  10/30/16 0857  HGBA1C 10.3*    CBG:  Recent Labs Lab 10/30/16 1657 10/30/16 2048 10/30/16 2350  10/31/16 0337 10/31/16 1218  GLUCAP 194* 115* 150* 146* 123*    Lipid Profile:  Recent Labs  10/30/16 0430  TRIG 315*    Thyroid Function Tests: No results for input(s): TSH, T4TOTAL, FREET4, T3FREE, THYROIDAB in the last 72 hours.  Anemia Panel:  Recent Labs  10/30/16 0857  VITAMINB12 571  FOLATE 11.7  FERRITIN 399*  TIBC 305  IRON 25*  RETICCTPCT 5.3*    Urine analysis:    Component Value Date/Time   COLORURINE YELLOW 10/18/2016 1745   APPEARANCEUR HAZY (A) 10/18/2016 1745   LABSPEC 1.012 10/18/2016 1745   PHURINE 6.0 10/18/2016 1745   GLUCOSEU 50 (A) 10/18/2016 1745   HGBUR LARGE (A) 10/18/2016 1745   BILIRUBINUR NEGATIVE 10/18/2016 1745   KETONESUR NEGATIVE 10/18/2016 1745   PROTEINUR 30 (A) 10/18/2016 1745   NITRITE NEGATIVE 10/18/2016 1745   LEUKOCYTESUR NEGATIVE 10/18/2016 1745    Sepsis Labs: Lactic Acid, Venous    Component Value Date/Time   LATICACIDVEN 0.8 10/30/2016 0430    MICROBIOLOGY: No results found for this or any previous visit (from the past 240 hour(s)).  RADIOLOGY STUDIES/RESULTS: Ct Chest W Contrast  Result Date: 10/18/2016 CLINICAL DATA:  Concern for sepsis in patient with pancreatitis. Further evaluation requested. Initial encounter. EXAM: CT CHEST, ABDOMEN, AND PELVIS WITH CONTRAST TECHNIQUE: Multidetector CT imaging of the chest, abdomen and pelvis was performed following the standard protocol during bolus administration of intravenous contrast. CONTRAST:  100 mL ISOVUE-300 IOPAMIDOL (ISOVUE-300) INJECTION 61% COMPARISON:  CT of the abdomen and pelvis performed 10/11/2016, and chest radiograph performed 10/13/2016 FINDINGS: CT CHEST FINDINGS Cardiovascular: The heart is normal in size. Scattered coronary artery calcifications are seen. A right IJ line is noted ending about the distal SVC. The thoracic aorta is grossly unremarkable. The great vessels are grossly unremarkable. Mediastinum/Nodes: The mediastinum is grossly unremarkable  in appearance. No mediastinal lymphadenopathy is seen. No pericardial effusion is identified. The visualized portions of the thyroid gland are unremarkable. No axillary lymphadenopathy is seen. A small nodule at the right breast is grossly stable from 2008 and likely reflects normal fibroglandular tissue. Lungs/Pleura: Small left and trace right pleural effusions are noted. Bibasilar airspace opacities may reflect atelectasis or possibly pneumonia. No pneumothorax is seen. No dominant mass is identified. Musculoskeletal: No acute osseous abnormalities are identified. The visualized musculature is unremarkable in appearance. CT ABDOMEN PELVIS FINDINGS Hepatobiliary: There is diffuse fatty infiltration within the liver. The gallbladder is partially filled with contrast. The common bile duct is not well characterized. Pancreas: There is diffuse soft tissue inflammation about the pancreas, with associated fluid tracking about the stomach, small bowel loops, liver, spleen, and along both paracolic gutters into the pelvis. This is compatible with relatively severe acute pancreatitis. No well defined pseudocyst is seen. There is no definite evidence of devascularization at this time. Spleen: The spleen is grossly unremarkable in appearance. Adrenals/Urinary Tract: The adrenal glands are unremarkable in appearance. The kidneys are within normal limits. There is no evidence of hydronephrosis. No renal or ureteral stones are identified. Nonspecific perinephric stranding is noted bilaterally. Stomach/Bowel: The stomach is unremarkable in appearance. The small bowel is within normal limits. The appendix is normal in caliber, without evidence of appendicitis. The colon is unremarkable in appearance. Vascular/Lymphatic: Minimal calcification is seen along the abdominal aorta. No retroperitoneal or pelvic sidewall lymphadenopathy is seen. Reproductive: The bladder is mildly distended and grossly unremarkable. The uterus is grossly  unremarkable. The ovaries are relatively symmetric. No suspicious adnexal masses are seen. Other: Diffuse soft tissue edema is seen along the abdominal and pelvic wall, likely reflecting anasarca. A small anterior abdominal wall hernia is seen just superior to the umbilicus, containing only fat. Musculoskeletal: No acute osseous abnormalities are identified. Vacuum phenomenon is noted at L5-S1. The visualized musculature is unremarkable in appearance. IMPRESSION: 1. Acute pancreatitis, with diffuse soft tissue inflammation about the pancreas, and small to moderate volume ascites within the abdomen and pelvis. No well defined pseudocyst seen. No definite evidence of devascularization at this time. 2. Small left and trace right pleural effusions. Bibasilar airspace opacities may reflect atelectasis or possibly pneumonia. 3. Diffuse soft tissue edema along the abdominal and pelvic wall, likely reflecting anasarca. 4. Scattered coronary artery calcifications seen. 5. Diffuse fatty infiltration within the liver. 6. Small anterior abdominal wall hernia just superior to the umbilicus, containing only fat. Electronically Signed   By: Roanna Raider M.D.   On: 10/18/2016 22:29   Ct Abdomen Pelvis W Contrast  Result Date: 10/18/2016 CLINICAL DATA:  Concern for sepsis in patient with pancreatitis. Further evaluation requested. Initial encounter. EXAM: CT CHEST, ABDOMEN, AND PELVIS WITH CONTRAST TECHNIQUE: Multidetector CT imaging  of the chest, abdomen and pelvis was performed following the standard protocol during bolus administration of intravenous contrast. CONTRAST:  100 mL ISOVUE-300 IOPAMIDOL (ISOVUE-300) INJECTION 61% COMPARISON:  CT of the abdomen and pelvis performed 10/11/2016, and chest radiograph performed 10/13/2016 FINDINGS: CT CHEST FINDINGS Cardiovascular: The heart is normal in size. Scattered coronary artery calcifications are seen. A right IJ line is noted ending about the distal SVC. The thoracic aorta is  grossly unremarkable. The great vessels are grossly unremarkable. Mediastinum/Nodes: The mediastinum is grossly unremarkable in appearance. No mediastinal lymphadenopathy is seen. No pericardial effusion is identified. The visualized portions of the thyroid gland are unremarkable. No axillary lymphadenopathy is seen. A small nodule at the right breast is grossly stable from 2008 and likely reflects normal fibroglandular tissue. Lungs/Pleura: Small left and trace right pleural effusions are noted. Bibasilar airspace opacities may reflect atelectasis or possibly pneumonia. No pneumothorax is seen. No dominant mass is identified. Musculoskeletal: No acute osseous abnormalities are identified. The visualized musculature is unremarkable in appearance. CT ABDOMEN PELVIS FINDINGS Hepatobiliary: There is diffuse fatty infiltration within the liver. The gallbladder is partially filled with contrast. The common bile duct is not well characterized. Pancreas: There is diffuse soft tissue inflammation about the pancreas, with associated fluid tracking about the stomach, small bowel loops, liver, spleen, and along both paracolic gutters into the pelvis. This is compatible with relatively severe acute pancreatitis. No well defined pseudocyst is seen. There is no definite evidence of devascularization at this time. Spleen: The spleen is grossly unremarkable in appearance. Adrenals/Urinary Tract: The adrenal glands are unremarkable in appearance. The kidneys are within normal limits. There is no evidence of hydronephrosis. No renal or ureteral stones are identified. Nonspecific perinephric stranding is noted bilaterally. Stomach/Bowel: The stomach is unremarkable in appearance. The small bowel is within normal limits. The appendix is normal in caliber, without evidence of appendicitis. The colon is unremarkable in appearance. Vascular/Lymphatic: Minimal calcification is seen along the abdominal aorta. No retroperitoneal or pelvic  sidewall lymphadenopathy is seen. Reproductive: The bladder is mildly distended and grossly unremarkable. The uterus is grossly unremarkable. The ovaries are relatively symmetric. No suspicious adnexal masses are seen. Other: Diffuse soft tissue edema is seen along the abdominal and pelvic wall, likely reflecting anasarca. A small anterior abdominal wall hernia is seen just superior to the umbilicus, containing only fat. Musculoskeletal: No acute osseous abnormalities are identified. Vacuum phenomenon is noted at L5-S1. The visualized musculature is unremarkable in appearance. IMPRESSION: 1. Acute pancreatitis, with diffuse soft tissue inflammation about the pancreas, and small to moderate volume ascites within the abdomen and pelvis. No well defined pseudocyst seen. No definite evidence of devascularization at this time. 2. Small left and trace right pleural effusions. Bibasilar airspace opacities may reflect atelectasis or possibly pneumonia. 3. Diffuse soft tissue edema along the abdominal and pelvic wall, likely reflecting anasarca. 4. Scattered coronary artery calcifications seen. 5. Diffuse fatty infiltration within the liver. 6. Small anterior abdominal wall hernia just superior to the umbilicus, containing only fat. Electronically Signed   By: Roanna RaiderJeffery  Chang M.D.   On: 10/18/2016 22:29   Dg Chest Port 1 View  Result Date: 10/23/2016 CLINICAL DATA:  Shortness of Breath EXAM: PORTABLE CHEST 1 VIEW COMPARISON:  Chest radiograph October 13, 2016 and chest CT October 18, 2016 FINDINGS: There is persistent consolidation in the left base with small left pleural effusion. Lungs elsewhere clear. Heart is upper normal in size with pulmonary vascularity within normal limits. Central catheter tip is  at the cavoatrial junction. No pneumothorax. No adenopathy. No bone lesions. IMPRESSION: Persistent left base consolidation with small left pleural effusion. Lungs elsewhere clear. Stable cardiac silhouette. No  pneumothorax. Electronically Signed   By: Bretta Bang III M.D.   On: 10/23/2016 10:00   Dg Chest Port 1 View  Result Date: 10/13/2016 CLINICAL DATA:  Shortness of Breath EXAM: PORTABLE CHEST 1 VIEW COMPARISON:  10/12/2016 FINDINGS: Very low lung volumes with bilateral airspace opacities. Heart is borderline in size. No definite effusions. Right central line and NG tube are unchanged. IMPRESSION: Very low lung volumes with diffuse bilateral airspace disease, worsened since prior study. Electronically Signed   By: Charlett Nose M.D.   On: 10/13/2016 07:38   Dg Chest Port 1 View  Result Date: 10/12/2016 CLINICAL DATA:  Acute respiratory distress EXAM: PORTABLE CHEST 1 VIEW COMPARISON:  10/12/2016 FINDINGS: Right jugular central line and nasogastric catheter are noted in satisfactory position. Cardiac shadow is stable. The overall inspiratory effort is poor with persistent left basilar atelectasis. No new focal abnormality is seen. IMPRESSION: No change from the prior exam. Electronically Signed   By: Alcide Clever M.D.   On: 10/12/2016 18:32   Dg Abd Portable 1v  Result Date: 10/13/2016 CLINICAL DATA:  Patient with ileus.  NG tube placement. EXAM: PORTABLE ABDOMEN - 1 VIEW COMPARISON:  CT abdomen pelvis 10/11/2016. FINDINGS: Enteric tube tip and side-port project over the stomach. Gas is demonstrated within mildly dilated loops of small bowel within the central and left hemiabdomen. At lumbar spine degenerative changes. IMPRESSION: Enteric tube tip and side-port project over the stomach. Suspect mild ileus. Electronically Signed   By: Annia Belt M.D.   On: 10/13/2016 11:28     LOS: 1 day   Jeoffrey Massed, MD  [facility] Pager:[phone]  If 7PM-7AM, please contact night-coverage [email or web address] Password [email or web address] 10/31/2016, 1:16 PM

## 2016-11-01 LAB — TYPE AND SCREEN
Blood Product Expiration Date: 201802222359
ISSUE DATE / TIME: 201802150855
UNIT TYPE AND RH: 6200

## 2016-11-01 LAB — GLUCOSE, CAPILLARY
GLUCOSE-CAPILLARY: 145 mg/dL — AB (ref 65–99)
Glucose-Capillary: 129 mg/dL — ABNORMAL HIGH (ref 65–99)
Glucose-Capillary: 127 mg/dL — ABNORMAL HIGH (ref 65–99)
Glucose-Capillary: 150 mg/dL — ABNORMAL HIGH (ref 65–99)
Glucose-Capillary: 122 mg/dL — ABNORMAL HIGH (ref 65–99)
Glucose-Capillary: 137 mg/dL — ABNORMAL HIGH (ref 65–99)

## 2016-11-01 MED ORDER — INSULIN ASPART 100 UNIT/ML ~~LOC~~ SOLN
0.0000 [IU] | SUBCUTANEOUS | Status: DC
  Administered 2016-11-02: 2 [IU] via SUBCUTANEOUS
  Administered 2016-11-02: 1 [IU] via SUBCUTANEOUS
  Administered 2016-11-02 (×2): 2 [IU] via SUBCUTANEOUS
  Administered 2016-11-03 (×2): 1 [IU] via SUBCUTANEOUS
  Administered 2016-11-03: 2 [IU] via SUBCUTANEOUS
  Administered 2016-11-03 – 2016-11-04 (×2): 1 [IU] via SUBCUTANEOUS

## 2016-11-01 NOTE — Progress Notes (Signed)
PROGRESS NOTE        PATIENT DETAILS Name: Melissa Beasley Age: 36 y.o. Sex: female Date of Birth: September 15, 1981 Admit Date: 10/30/2016 Admitting Physician Eduard Clos, MD ZOX:WRUEAVW, DANA, DO  Brief Narrative: Patient is a 36 y.o. female with recent hospitalization from 10/12/16-10/24/16 with acute pancreatitis due to hypertriglyceridemia, she was managed conservatively and subsequently discharged home. Post discharge, she was doing well for her day or so, following which she started having worsening abdominal pain. She subsequently was evaluated at [facility] emergency room, where she was diagnosed with a large pseudocyst causing mass effect on the stomach. She was subsequently transferred to [facility] for further evaluation and treatment. See below for further details  Subjective: Abdominal pain continues to improve-has not had any vomiting since admission. Also has not had a bowel movement since admission. Easily tolerating clear liquids, and is anxious for her diet to be upgraded.  Assessment/Plan: Evolving pseudocyst with mass effect on the stomach: Clearly likely due to recent pancreatitis. GI consulted-recommendations are to continue with supportive measures, and to repeat CT scan of the abdomen in 1-2 months. Advance to full liquids today, stop IV narcotics. Abdomen is softer with hardly any tenderness this morning.  Recent pancreatitis due to hypertriglyceridemia: Abdominal pain is much better-very benign abdominal exam. Plans are to titrate off IV narcotics, and minimize oral narcotics as much as possible. CT of the abdomen/pelvis done at [facility] showed no signs of pancreatitis or necrosis. Lipase levels are within normal limits.   Hypertriglyceridemia: Triglycerides most recent admission was more than 5000, currently down to 315. Continue TriCor and statin-LFTs appear stable.  Anemia: Probably related to prolonged acute  illness-although gives a history of black stools-no overt GI bleeding evident-hemoglobin drifted down to 6.7-subsequently transferred 1 unit of PRBC on 2/15-hemoglobin stable at 8.4. Continue to trend CBC periodically.   Type 2 diabetes: CBGs appear stable-with SSI, resume oral hypoglycemic agents on discharge. A1c 10.3  Morbid obesity: We'll continue counseling over the next few days.  Anxiety: Stable this morning-continue as needed Xanax.   DVT Prophylaxis: SCD's-for now-may need to initiate pharmacological prophylaxis if hemoglobin remains stable without any evidence of overt bleeding.  Code Status: Full code   Family Communication: None at bedside  Disposition Plan: Remain inpatient-home on 2/17 if clinical improvement continues.  Antimicrobial agents: Anti-infectives    None      Procedures: None  CONSULTS:  GI  Time spent: 25- minutes-Greater than 50% of this time was spent in counseling, explanation of diagnosis, planning of further management, and coordination of care.  MEDICATIONS: Scheduled Meds: . atorvastatin  40 mg Oral q1800  . fenofibrate  160 mg Oral Daily  . insulin aspart  0-9 Units Subcutaneous Q4H  . pantoprazole  40 mg Oral Q1200  . polyethylene glycol  17 g Oral Daily  . senna  2 tablet Oral QHS   Continuous Infusions:  PRN Meds:.albuterol, ALPRAZolam, ondansetron **OR** ondansetron (ZOFRAN) IV, oxyCODONE-acetaminophen **AND** oxyCODONE   PHYSICAL EXAM: Vital signs: Vitals:   10/31/16 1401 10/31/16 2013 11/01/16 0003 11/01/16 0555  BP: 104/70 (!) 146/83  137/75  Pulse: 94 (!) 101  80  Resp: 20 18  18   Temp: 99 F (37.2 C) 99 F (37.2 C)  98.3 F (36.8 C)  TempSrc: Oral Oral  Oral  SpO2: 99% 96%  98%  Weight:   92.7 kg (204 lb 4.8 oz)   Height:       Filed Weights   10/30/16 0350 11/01/16 0003  Weight: 95.4 kg (210 lb 4.8 oz) 92.7 kg (204 lb 4.8 oz)   Body mass index is 32.97 kg/m.   General appearance :Awake, alert, not in  any distress. Speech Clear. Eyes:, pupils equally reactive to light and accomodation,no scleral icterus. HEENT: Atraumatic and Normocephalic Neck: supple, no JVD. No cervical lymphadenopathy.  Resp:Good air entry bilaterally, no added sounds  CVS: S1 S2 regular, no murmurs.  GI: Bowel sounds present,non tender this morning Extremities: B/L Lower Ext shows no edema, both legs are warm to touch Neurology:  speech clear,Non focal, sensation is grossly intact. Psychiatric: Normal judgment and insight. Alert and oriented x 3.  Musculoskeletal:No digital cyanosis Skin:No Rash, warm and dry Wounds:N/A  I have personally reviewed following labs and imaging studies  LABORATORY DATA: CBC:  Recent Labs Lab 10/30/16 0430 10/31/16 0430 10/31/16 1347 10/31/16 2049  WBC 10.9* 6.9  --  6.3  NEUTROABS 7.0  --   --   --   HGB 7.6* 6.7* 7.9* 8.4*  HCT 24.5* 21.8* 25.2* 26.8*  MCV 88.4 89.0  --  87.3  PLT 524* 434*  --  460*    Basic Metabolic Panel:  Recent Labs Lab 10/30/16 0430 10/31/16 0430  NA 138 137  K 4.4 4.2  CL 102 104  CO2 26 24  GLUCOSE 181* 136*  BUN 15 13  CREATININE 1.23* 0.93  CALCIUM 8.9 8.8*  MG  --  1.9    GFR: Estimated Creatinine Clearance: 96.9 mL/min (by C-G formula based on SCr of 0.93 mg/dL).  Liver Function Tests:  Recent Labs Lab 10/30/16 0430 10/31/16 0430  AST 27 25  ALT 26 23  ALKPHOS 49 41  BILITOT 0.4 0.7  PROT 6.8 6.0*  ALBUMIN 2.6* 2.3*    Recent Labs Lab 10/30/16 0430  LIPASE 36   No results for input(s): AMMONIA in the last 168 hours.  Coagulation Profile: No results for input(s): INR, PROTIME in the last 168 hours.  Cardiac Enzymes: No results for input(s): CKTOTAL, CKMB, CKMBINDEX, TROPONINI in the last 168 hours.  BNP (last 3 results) No results for input(s): PROBNP in the last 8760 hours.  HbA1C:  Recent Labs  10/30/16 0857  HGBA1C 10.3*    CBG:  Recent Labs Lab 10/31/16 2012 10/31/16 2138  11/01/16 0133 11/01/16 0553 11/01/16 0806  GLUCAP 138* 145* 129* 127* 150*    Lipid Profile:  Recent Labs  10/30/16 0430  TRIG 315*    Thyroid Function Tests: No results for input(s): TSH, T4TOTAL, FREET4, T3FREE, THYROIDAB in the last 72 hours.  Anemia Panel:  Recent Labs  10/30/16 0857  VITAMINB12 571  FOLATE 11.7  FERRITIN 399*  TIBC 305  IRON 25*  RETICCTPCT 5.3*    Urine analysis:    Component Value Date/Time   COLORURINE YELLOW 10/18/2016 1745   APPEARANCEUR HAZY (A) 10/18/2016 1745   LABSPEC 1.012 10/18/2016 1745   PHURINE 6.0 10/18/2016 1745   GLUCOSEU 50 (A) 10/18/2016 1745   HGBUR LARGE (A) 10/18/2016 1745   BILIRUBINUR NEGATIVE 10/18/2016 1745   KETONESUR NEGATIVE 10/18/2016 1745   PROTEINUR 30 (A) 10/18/2016 1745   NITRITE NEGATIVE 10/18/2016 1745   LEUKOCYTESUR NEGATIVE 10/18/2016 1745    Sepsis Labs: Lactic Acid, Venous    Component Value Date/Time   LATICACIDVEN 0.8 10/30/2016 0430    MICROBIOLOGY: No results  found for this or any previous visit (from the past 240 hour(s)).  RADIOLOGY STUDIES/RESULTS: Ct Chest W Contrast  Result Date: 10/18/2016 CLINICAL DATA:  Concern for sepsis in patient with pancreatitis. Further evaluation requested. Initial encounter. EXAM: CT CHEST, ABDOMEN, AND PELVIS WITH CONTRAST TECHNIQUE: Multidetector CT imaging of the chest, abdomen and pelvis was performed following the standard protocol during bolus administration of intravenous contrast. CONTRAST:  100 mL ISOVUE-300 IOPAMIDOL (ISOVUE-300) INJECTION 61% COMPARISON:  CT of the abdomen and pelvis performed 10/11/2016, and chest radiograph performed 10/13/2016 FINDINGS: CT CHEST FINDINGS Cardiovascular: The heart is normal in size. Scattered coronary artery calcifications are seen. A right IJ line is noted ending about the distal SVC. The thoracic aorta is grossly unremarkable. The great vessels are grossly unremarkable. Mediastinum/Nodes: The mediastinum is  grossly unremarkable in appearance. No mediastinal lymphadenopathy is seen. No pericardial effusion is identified. The visualized portions of the thyroid gland are unremarkable. No axillary lymphadenopathy is seen. A small nodule at the right breast is grossly stable from 2008 and likely reflects normal fibroglandular tissue. Lungs/Pleura: Small left and trace right pleural effusions are noted. Bibasilar airspace opacities may reflect atelectasis or possibly pneumonia. No pneumothorax is seen. No dominant mass is identified. Musculoskeletal: No acute osseous abnormalities are identified. The visualized musculature is unremarkable in appearance. CT ABDOMEN PELVIS FINDINGS Hepatobiliary: There is diffuse fatty infiltration within the liver. The gallbladder is partially filled with contrast. The common bile duct is not well characterized. Pancreas: There is diffuse soft tissue inflammation about the pancreas, with associated fluid tracking about the stomach, small bowel loops, liver, spleen, and along both paracolic gutters into the pelvis. This is compatible with relatively severe acute pancreatitis. No well defined pseudocyst is seen. There is no definite evidence of devascularization at this time. Spleen: The spleen is grossly unremarkable in appearance. Adrenals/Urinary Tract: The adrenal glands are unremarkable in appearance. The kidneys are within normal limits. There is no evidence of hydronephrosis. No renal or ureteral stones are identified. Nonspecific perinephric stranding is noted bilaterally. Stomach/Bowel: The stomach is unremarkable in appearance. The small bowel is within normal limits. The appendix is normal in caliber, without evidence of appendicitis. The colon is unremarkable in appearance. Vascular/Lymphatic: Minimal calcification is seen along the abdominal aorta. No retroperitoneal or pelvic sidewall lymphadenopathy is seen. Reproductive: The bladder is mildly distended and grossly unremarkable.  The uterus is grossly unremarkable. The ovaries are relatively symmetric. No suspicious adnexal masses are seen. Other: Diffuse soft tissue edema is seen along the abdominal and pelvic wall, likely reflecting anasarca. A small anterior abdominal wall hernia is seen just superior to the umbilicus, containing only fat. Musculoskeletal: No acute osseous abnormalities are identified. Vacuum phenomenon is noted at L5-S1. The visualized musculature is unremarkable in appearance. IMPRESSION: 1. Acute pancreatitis, with diffuse soft tissue inflammation about the pancreas, and small to moderate volume ascites within the abdomen and pelvis. No well defined pseudocyst seen. No definite evidence of devascularization at this time. 2. Small left and trace right pleural effusions. Bibasilar airspace opacities may reflect atelectasis or possibly pneumonia. 3. Diffuse soft tissue edema along the abdominal and pelvic wall, likely reflecting anasarca. 4. Scattered coronary artery calcifications seen. 5. Diffuse fatty infiltration within the liver. 6. Small anterior abdominal wall hernia just superior to the umbilicus, containing only fat. Electronically Signed   By: Roanna Raider M.D.   On: 10/18/2016 22:29   Ct Abdomen Pelvis W Contrast  Result Date: 10/18/2016 CLINICAL DATA:  Concern for sepsis in  patient with pancreatitis. Further evaluation requested. Initial encounter. EXAM: CT CHEST, ABDOMEN, AND PELVIS WITH CONTRAST TECHNIQUE: Multidetector CT imaging of the chest, abdomen and pelvis was performed following the standard protocol during bolus administration of intravenous contrast. CONTRAST:  100 mL ISOVUE-300 IOPAMIDOL (ISOVUE-300) INJECTION 61% COMPARISON:  CT of the abdomen and pelvis performed 10/11/2016, and chest radiograph performed 10/13/2016 FINDINGS: CT CHEST FINDINGS Cardiovascular: The heart is normal in size. Scattered coronary artery calcifications are seen. A right IJ line is noted ending about the distal SVC.  The thoracic aorta is grossly unremarkable. The great vessels are grossly unremarkable. Mediastinum/Nodes: The mediastinum is grossly unremarkable in appearance. No mediastinal lymphadenopathy is seen. No pericardial effusion is identified. The visualized portions of the thyroid gland are unremarkable. No axillary lymphadenopathy is seen. A small nodule at the right breast is grossly stable from 2008 and likely reflects normal fibroglandular tissue. Lungs/Pleura: Small left and trace right pleural effusions are noted. Bibasilar airspace opacities may reflect atelectasis or possibly pneumonia. No pneumothorax is seen. No dominant mass is identified. Musculoskeletal: No acute osseous abnormalities are identified. The visualized musculature is unremarkable in appearance. CT ABDOMEN PELVIS FINDINGS Hepatobiliary: There is diffuse fatty infiltration within the liver. The gallbladder is partially filled with contrast. The common bile duct is not well characterized. Pancreas: There is diffuse soft tissue inflammation about the pancreas, with associated fluid tracking about the stomach, small bowel loops, liver, spleen, and along both paracolic gutters into the pelvis. This is compatible with relatively severe acute pancreatitis. No well defined pseudocyst is seen. There is no definite evidence of devascularization at this time. Spleen: The spleen is grossly unremarkable in appearance. Adrenals/Urinary Tract: The adrenal glands are unremarkable in appearance. The kidneys are within normal limits. There is no evidence of hydronephrosis. No renal or ureteral stones are identified. Nonspecific perinephric stranding is noted bilaterally. Stomach/Bowel: The stomach is unremarkable in appearance. The small bowel is within normal limits. The appendix is normal in caliber, without evidence of appendicitis. The colon is unremarkable in appearance. Vascular/Lymphatic: Minimal calcification is seen along the abdominal aorta. No  retroperitoneal or pelvic sidewall lymphadenopathy is seen. Reproductive: The bladder is mildly distended and grossly unremarkable. The uterus is grossly unremarkable. The ovaries are relatively symmetric. No suspicious adnexal masses are seen. Other: Diffuse soft tissue edema is seen along the abdominal and pelvic wall, likely reflecting anasarca. A small anterior abdominal wall hernia is seen just superior to the umbilicus, containing only fat. Musculoskeletal: No acute osseous abnormalities are identified. Vacuum phenomenon is noted at L5-S1. The visualized musculature is unremarkable in appearance. IMPRESSION: 1. Acute pancreatitis, with diffuse soft tissue inflammation about the pancreas, and small to moderate volume ascites within the abdomen and pelvis. No well defined pseudocyst seen. No definite evidence of devascularization at this time. 2. Small left and trace right pleural effusions. Bibasilar airspace opacities may reflect atelectasis or possibly pneumonia. 3. Diffuse soft tissue edema along the abdominal and pelvic wall, likely reflecting anasarca. 4. Scattered coronary artery calcifications seen. 5. Diffuse fatty infiltration within the liver. 6. Small anterior abdominal wall hernia just superior to the umbilicus, containing only fat. Electronically Signed   By: Roanna Raider M.D.   On: 10/18/2016 22:29   Dg Chest Port 1 View  Result Date: 10/23/2016 CLINICAL DATA:  Shortness of Breath EXAM: PORTABLE CHEST 1 VIEW COMPARISON:  Chest radiograph October 13, 2016 and chest CT October 18, 2016 FINDINGS: There is persistent consolidation in the left base with small left pleural  effusion. Lungs elsewhere clear. Heart is upper normal in size with pulmonary vascularity within normal limits. Central catheter tip is at the cavoatrial junction. No pneumothorax. No adenopathy. No bone lesions. IMPRESSION: Persistent left base consolidation with small left pleural effusion. Lungs elsewhere clear. Stable cardiac  silhouette. No pneumothorax. Electronically Signed   By: Bretta BangWilliam  Woodruff III M.D.   On: 10/23/2016 10:00   Dg Chest Port 1 View  Result Date: 10/13/2016 CLINICAL DATA:  Shortness of Breath EXAM: PORTABLE CHEST 1 VIEW COMPARISON:  10/12/2016 FINDINGS: Very low lung volumes with bilateral airspace opacities. Heart is borderline in size. No definite effusions. Right central line and NG tube are unchanged. IMPRESSION: Very low lung volumes with diffuse bilateral airspace disease, worsened since prior study. Electronically Signed   By: Charlett NoseKevin  Dover M.D.   On: 10/13/2016 07:38   Dg Chest Port 1 View  Result Date: 10/12/2016 CLINICAL DATA:  Acute respiratory distress EXAM: PORTABLE CHEST 1 VIEW COMPARISON:  10/12/2016 FINDINGS: Right jugular central line and nasogastric catheter are noted in satisfactory position. Cardiac shadow is stable. The overall inspiratory effort is poor with persistent left basilar atelectasis. No new focal abnormality is seen. IMPRESSION: No change from the prior exam. Electronically Signed   By: Alcide CleverMark  Lukens M.D.   On: 10/12/2016 18:32   Dg Abd Portable 1v  Result Date: 10/13/2016 CLINICAL DATA:  Patient with ileus.  NG tube placement. EXAM: PORTABLE ABDOMEN - 1 VIEW COMPARISON:  CT abdomen pelvis 10/11/2016. FINDINGS: Enteric tube tip and side-port project over the stomach. Gas is demonstrated within mildly dilated loops of small bowel within the central and left hemiabdomen. At lumbar spine degenerative changes. IMPRESSION: Enteric tube tip and side-port project over the stomach. Suspect mild ileus. Electronically Signed   By: Annia Beltrew  Davis M.D.   On: 10/13/2016 11:28     LOS: 2 days   Jeoffrey MassedGHIMIRE,SHANKER, MD  [facility] Pager:[phone]  If 7PM-7AM, please contact night-coverage [email or web address] Password [email or web address] 11/01/2016, 9:53 AM

## 2016-11-02 ENCOUNTER — Inpatient Hospital Stay (HOSPITAL_COMMUNITY): Payer: BLUE CROSS/BLUE SHIELD

## 2016-11-02 LAB — COMPREHENSIVE METABOLIC PANEL
ALT: 21 U/L (ref 14–54)
ANION GAP: 12 (ref 5–15)
AST: 23 U/L (ref 15–41)
Albumin: 2.7 g/dL — ABNORMAL LOW (ref 3.5–5.0)
Alkaline Phosphatase: 46 U/L (ref 38–126)
BUN: 11 mg/dL (ref 6–20)
CO2: 23 mmol/L (ref 22–32)
CREATININE: 0.9 mg/dL (ref 0.44–1.00)
Calcium: 9.1 mg/dL (ref 8.9–10.3)
Chloride: 102 mmol/L (ref 101–111)
GFR calc non Af Amer: >60 mL/min (ref >60)
Glucose, Bld: 150 mg/dL — ABNORMAL HIGH (ref 65–99)
POTASSIUM: 3.8 mmol/L (ref 3.5–5.1)
SODIUM: 137 mmol/L (ref 135–145)
Total Bilirubin: 0.6 mg/dL (ref 0.3–1.2)
Total Protein: 6.9 g/dL (ref 6.5–8.1)

## 2016-11-02 LAB — CBC
HEMATOCRIT: 26.9 % — AB (ref 36.0–46.0)
Hemoglobin: 8.7 g/dL — ABNORMAL LOW (ref 12.0–15.0)
MCH: 28.2 pg (ref 26.0–34.0)
MCHC: 32.3 g/dL (ref 30.0–36.0)
MCV: 87.1 fL (ref 78.0–100.0)
Platelets: 427 10*3/uL — ABNORMAL HIGH (ref 150–400)
RBC: 3.09 MIL/uL — ABNORMAL LOW (ref 3.87–5.11)
RDW: 14.2 % (ref 11.5–15.5)
WBC: 6.2 10*3/uL (ref 4.0–10.5)

## 2016-11-02 LAB — GLUCOSE, CAPILLARY
GLUCOSE-CAPILLARY: 122 mg/dL — AB (ref 65–99)
GLUCOSE-CAPILLARY: 130 mg/dL — AB (ref 65–99)
GLUCOSE-CAPILLARY: 157 mg/dL — AB (ref 65–99)
GLUCOSE-CAPILLARY: 157 mg/dL — AB (ref 65–99)
GLUCOSE-CAPILLARY: 163 mg/dL — AB (ref 65–99)
Glucose-Capillary: 136 mg/dL — ABNORMAL HIGH (ref 65–99)

## 2016-11-02 MED ORDER — LACTULOSE 10 GM/15ML PO SOLN
20.0000 g | Freq: Once | ORAL | Status: DC
  Filled 2016-11-02: qty 30

## 2016-11-02 MED ORDER — ENOXAPARIN SODIUM 40 MG/0.4ML ~~LOC~~ SOLN
40.0000 mg | SUBCUTANEOUS | Status: DC
  Administered 2016-11-02: 40 mg via SUBCUTANEOUS
  Filled 2016-11-02 (×2): qty 0.4

## 2016-11-02 MED ORDER — BISACODYL 10 MG RE SUPP
10.0000 mg | Freq: Every day | RECTAL | Status: DC
  Administered 2016-11-02: 10 mg via RECTAL
  Filled 2016-11-02: qty 1

## 2016-11-02 MED ORDER — BISACODYL 10 MG RE SUPP: 10.0000 mg | Freq: Every day | RECTAL | Status: DC | PRN

## 2016-11-02 NOTE — Progress Notes (Signed)
PROGRESS NOTE        PATIENT DETAILS Name: Melissa Beasley Age: 36 y.o. Sex: female Date of Birth: 1981/06/26 Admit Date: 10/30/2016 Admitting Physician Eduard Clos, MD ZOX:WRUEAVW, DANA, DO  Brief Narrative: Patient is a 36 y.o. female with recent hospitalization from 10/12/16-10/24/16 with acute pancreatitis due to hypertriglyceridemia, she was managed conservatively and subsequently discharged home. Post discharge, she was doing well for her day or so, following which she started having worsening abdominal pain. She subsequently was evaluated at [facility] emergency room, where she was diagnosed with a large pseudocyst causing mass effect on the stomach. She was subsequently transferred to [facility] for further evaluation and treatment. See below for further details  Subjective: Tearful this morning-claims that abdominal pain has worsened after she ate a regular diet last evening. Has not had a bowel movement since admission (has been on scheduled MiraLAX/senna)   Assessment/Plan: Evolving pseudocyst with mass effect on the stomach: Clearly likely due to recent pancreatitis. GI consulted-recommendations are to continue with supportive measures, and to repeat CT scan of the abdomen in 1-2 months. She was titrated off IV Dilaudid and diet was slowly advanced to soft diet 2/16 evening, she claims that her pain has now worsened since last night. On exam, she does have some tenderness mostly around the periumbilical area-but there are no peritoneal signs. She also has not had a bowel movement since admission-x-ray of the abdomen does not show any acute abnormalities, have asked RN to give one dose of Dulcolax suppository-downgraded to full liquids. If she continues to have abdominal pain even after a bowel movement, she may need reimaging prior to discharge.   Recent pancreatitis due to hypertriglyceridemia: See above regarding abdominal pain.  Abdominal pain is much better-very benign abdominal exam. CT of the abdomen/pelvis done at [facility] showed no signs of pancreatitis or necrosis. Lipase levels are within normal limits.   Hypertriglyceridemia: Triglycerides most recent admission was more than 5000, currently down to 315. Continue TriCor and statin-LFTs appear stable.  Anemia: Probably related to prolonged acute illness-although gives a history of black stools-no overt GI bleeding evident-hemoglobin drifted down to 6.7-subsequently transferred 1 unit of PRBC on 2/15-hemoglobin stable at 8.7. Continue to trend CBC periodically.   Type 2 diabetes: CBGs appear stable-with SSI, resume oral hypoglycemic agents on discharge. A1c 10.3  Morbid obesity: Counseled.  Anxiety: Stable this morning-continue as needed Xanax.   DVT Prophylaxis: We will start Lovenox as hemoglobin now stable. She previously was on SCDs.   Code Status: Full code   Family Communication: None at bedside  Disposition Plan: Remain inpatient-home in the next 1-2 days-if abdominal pain is controlled and diet advancement tolerated.Marland Kitchen  Antimicrobial agents: Anti-infectives    None      Procedures: None  CONSULTS:  GI  Time spent: 25- minutes-Greater than 50% of this time was spent in counseling, explanation of diagnosis, planning of further management, and coordination of care.  MEDICATIONS: Scheduled Meds: . atorvastatin  40 mg Oral q1800  . fenofibrate  160 mg Oral Daily  . insulin aspart  0-9 Units Subcutaneous Q4H  . pantoprazole  40 mg Oral Q1200  . polyethylene glycol  17 g Oral Daily  . senna  2 tablet Oral QHS   Continuous Infusions:  PRN Meds:.albuterol, ALPRAZolam, bisacodyl, ondansetron **OR** ondansetron (ZOFRAN) IV, oxyCODONE-acetaminophen **AND** [DISCONTINUED]  oxyCODONE   PHYSICAL EXAM: Vital signs: Vitals:   11/02/16 0009 11/02/16 0408 11/02/16 0410 11/02/16 0500  BP:  (!) 149/100 131/84   Pulse:  91 94   Resp:   18 (!) 8 18  Temp:   99 F (37.2 C)   TempSrc:   Oral   SpO2:  98% 99%   Weight: 91.4 kg (201 lb 6.4 oz)   91.4 kg (201 lb 8 oz)  Height:       Filed Weights   11/01/16 0003 11/02/16 0009 11/02/16 0500  Weight: 92.7 kg (204 lb 4.8 oz) 91.4 kg (201 lb 6.4 oz) 91.4 kg (201 lb 8 oz)   Body mass index is 32.52 kg/m.   General appearance :Awake, alert, not in any distress. Speech Clear. Eyes:, pupils equally reactive to light and accomodation,no scleral icterus. HEENT: Atraumatic and Normocephalic Neck: supple, no JVD. No cervical lymphadenopathy.  Resp:Good air entry bilaterally, no added sounds  CVS: S1 S2 regular, no murmurs.  GI: Bowel sounds present, mildly tender in the periumbilical area without any peritoneal signs. Extremities: B/L Lower Ext shows no edema, both legs are warm to touch Neurology:  speech clear,Non focal, sensation is grossly intact. Psychiatric: Normal judgment and insight. Alert and oriented x 3.  Musculoskeletal:No digital cyanosis Skin:No Rash, warm and dry Wounds:N/A  I have personally reviewed following labs and imaging studies  LABORATORY DATA: CBC:  Recent Labs Lab 10/30/16 0430 10/31/16 0430 10/31/16 1347 10/31/16 2049 11/02/16 0614  WBC 10.9* 6.9  --  6.3 6.2  NEUTROABS 7.0  --   --   --   --   HGB 7.6* 6.7* 7.9* 8.4* 8.7*  HCT 24.5* 21.8* 25.2* 26.8* 26.9*  MCV 88.4 89.0  --  87.3 87.1  PLT 524* 434*  --  460* 427*    Basic Metabolic Panel:  Recent Labs Lab 10/30/16 0430 10/31/16 0430 11/02/16 1121  NA 138 137 137  K 4.4 4.2 3.8  CL 102 104 102  CO2 26 24 23   GLUCOSE 181* 136* 150*  BUN 15 13 11   CREATININE 1.23* 0.93 0.90  CALCIUM 8.9 8.8* 9.1  MG  --  1.9  --     GFR: Estimated Creatinine Clearance: 99.3 mL/min (by C-G formula based on SCr of 0.9 mg/dL).  Liver Function Tests:  Recent Labs Lab 10/30/16 0430 10/31/16 0430 11/02/16 1121  AST 27 25 23   ALT 26 23 21   ALKPHOS 49 41 46  BILITOT 0.4 0.7 0.6    PROT 6.8 6.0* 6.9  ALBUMIN 2.6* 2.3* 2.7*    Recent Labs Lab 10/30/16 0430  LIPASE 36   No results for input(s): AMMONIA in the last 168 hours.  Coagulation Profile: No results for input(s): INR, PROTIME in the last 168 hours.  Cardiac Enzymes: No results for input(s): CKTOTAL, CKMB, CKMBINDEX, TROPONINI in the last 168 hours.  BNP (last 3 results) No results for input(s): PROBNP in the last 8760 hours.  HbA1C: No results for input(s): HGBA1C in the last 72 hours.  CBG:  Recent Labs Lab 11/01/16 2007 11/02/16 0006 11/02/16 0407 11/02/16 0807 11/02/16 1156  GLUCAP 137* 157* 122* 136* 157*    Lipid Profile: No results for input(s): CHOL, HDL, LDLCALC, TRIG, CHOLHDL, LDLDIRECT in the last 72 hours.  Thyroid Function Tests: No results for input(s): TSH, T4TOTAL, FREET4, T3FREE, THYROIDAB in the last 72 hours.  Anemia Panel: No results for input(s): VITAMINB12, FOLATE, FERRITIN, TIBC, IRON, RETICCTPCT in the last 72 hours.  Urine analysis:    Component Value Date/Time   COLORURINE YELLOW 10/18/2016 1745   APPEARANCEUR HAZY (A) 10/18/2016 1745   LABSPEC 1.012 10/18/2016 1745   PHURINE 6.0 10/18/2016 1745   GLUCOSEU 50 (A) 10/18/2016 1745   HGBUR LARGE (A) 10/18/2016 1745   BILIRUBINUR NEGATIVE 10/18/2016 1745   KETONESUR NEGATIVE 10/18/2016 1745   PROTEINUR 30 (A) 10/18/2016 1745   NITRITE NEGATIVE 10/18/2016 1745   LEUKOCYTESUR NEGATIVE 10/18/2016 1745    Sepsis Labs: Lactic Acid, Venous    Component Value Date/Time   LATICACIDVEN 0.8 10/30/2016 0430    MICROBIOLOGY: No results found for this or any previous visit (from the past 240 hour(s)).  RADIOLOGY STUDIES/RESULTS: Ct Chest W Contrast  Result Date: 10/18/2016 CLINICAL DATA:  Concern for sepsis in patient with pancreatitis. Further evaluation requested. Initial encounter. EXAM: CT CHEST, ABDOMEN, AND PELVIS WITH CONTRAST TECHNIQUE: Multidetector CT imaging of the chest, abdomen and pelvis was  performed following the standard protocol during bolus administration of intravenous contrast. CONTRAST:  100 mL ISOVUE-300 IOPAMIDOL (ISOVUE-300) INJECTION 61% COMPARISON:  CT of the abdomen and pelvis performed 10/11/2016, and chest radiograph performed 10/13/2016 FINDINGS: CT CHEST FINDINGS Cardiovascular: The heart is normal in size. Scattered coronary artery calcifications are seen. A right IJ line is noted ending about the distal SVC. The thoracic aorta is grossly unremarkable. The great vessels are grossly unremarkable. Mediastinum/Nodes: The mediastinum is grossly unremarkable in appearance. No mediastinal lymphadenopathy is seen. No pericardial effusion is identified. The visualized portions of the thyroid gland are unremarkable. No axillary lymphadenopathy is seen. A small nodule at the right breast is grossly stable from 2008 and likely reflects normal fibroglandular tissue. Lungs/Pleura: Small left and trace right pleural effusions are noted. Bibasilar airspace opacities may reflect atelectasis or possibly pneumonia. No pneumothorax is seen. No dominant mass is identified. Musculoskeletal: No acute osseous abnormalities are identified. The visualized musculature is unremarkable in appearance. CT ABDOMEN PELVIS FINDINGS Hepatobiliary: There is diffuse fatty infiltration within the liver. The gallbladder is partially filled with contrast. The common bile duct is not well characterized. Pancreas: There is diffuse soft tissue inflammation about the pancreas, with associated fluid tracking about the stomach, small bowel loops, liver, spleen, and along both paracolic gutters into the pelvis. This is compatible with relatively severe acute pancreatitis. No well defined pseudocyst is seen. There is no definite evidence of devascularization at this time. Spleen: The spleen is grossly unremarkable in appearance. Adrenals/Urinary Tract: The adrenal glands are unremarkable in appearance. The kidneys are within  normal limits. There is no evidence of hydronephrosis. No renal or ureteral stones are identified. Nonspecific perinephric stranding is noted bilaterally. Stomach/Bowel: The stomach is unremarkable in appearance. The small bowel is within normal limits. The appendix is normal in caliber, without evidence of appendicitis. The colon is unremarkable in appearance. Vascular/Lymphatic: Minimal calcification is seen along the abdominal aorta. No retroperitoneal or pelvic sidewall lymphadenopathy is seen. Reproductive: The bladder is mildly distended and grossly unremarkable. The uterus is grossly unremarkable. The ovaries are relatively symmetric. No suspicious adnexal masses are seen. Other: Diffuse soft tissue edema is seen along the abdominal and pelvic wall, likely reflecting anasarca. A small anterior abdominal wall hernia is seen just superior to the umbilicus, containing only fat. Musculoskeletal: No acute osseous abnormalities are identified. Vacuum phenomenon is noted at L5-S1. The visualized musculature is unremarkable in appearance. IMPRESSION: 1. Acute pancreatitis, with diffuse soft tissue inflammation about the pancreas, and small to moderate volume ascites within the abdomen and  pelvis. No well defined pseudocyst seen. No definite evidence of devascularization at this time. 2. Small left and trace right pleural effusions. Bibasilar airspace opacities may reflect atelectasis or possibly pneumonia. 3. Diffuse soft tissue edema along the abdominal and pelvic wall, likely reflecting anasarca. 4. Scattered coronary artery calcifications seen. 5. Diffuse fatty infiltration within the liver. 6. Small anterior abdominal wall hernia just superior to the umbilicus, containing only fat. Electronically Signed   By: Roanna Raider M.D.   On: 10/18/2016 22:29   Ct Abdomen Pelvis W Contrast  Result Date: 10/18/2016 CLINICAL DATA:  Concern for sepsis in patient with pancreatitis. Further evaluation requested. Initial  encounter. EXAM: CT CHEST, ABDOMEN, AND PELVIS WITH CONTRAST TECHNIQUE: Multidetector CT imaging of the chest, abdomen and pelvis was performed following the standard protocol during bolus administration of intravenous contrast. CONTRAST:  100 mL ISOVUE-300 IOPAMIDOL (ISOVUE-300) INJECTION 61% COMPARISON:  CT of the abdomen and pelvis performed 10/11/2016, and chest radiograph performed 10/13/2016 FINDINGS: CT CHEST FINDINGS Cardiovascular: The heart is normal in size. Scattered coronary artery calcifications are seen. A right IJ line is noted ending about the distal SVC. The thoracic aorta is grossly unremarkable. The great vessels are grossly unremarkable. Mediastinum/Nodes: The mediastinum is grossly unremarkable in appearance. No mediastinal lymphadenopathy is seen. No pericardial effusion is identified. The visualized portions of the thyroid gland are unremarkable. No axillary lymphadenopathy is seen. A small nodule at the right breast is grossly stable from 2008 and likely reflects normal fibroglandular tissue. Lungs/Pleura: Small left and trace right pleural effusions are noted. Bibasilar airspace opacities may reflect atelectasis or possibly pneumonia. No pneumothorax is seen. No dominant mass is identified. Musculoskeletal: No acute osseous abnormalities are identified. The visualized musculature is unremarkable in appearance. CT ABDOMEN PELVIS FINDINGS Hepatobiliary: There is diffuse fatty infiltration within the liver. The gallbladder is partially filled with contrast. The common bile duct is not well characterized. Pancreas: There is diffuse soft tissue inflammation about the pancreas, with associated fluid tracking about the stomach, small bowel loops, liver, spleen, and along both paracolic gutters into the pelvis. This is compatible with relatively severe acute pancreatitis. No well defined pseudocyst is seen. There is no definite evidence of devascularization at this time. Spleen: The spleen is  grossly unremarkable in appearance. Adrenals/Urinary Tract: The adrenal glands are unremarkable in appearance. The kidneys are within normal limits. There is no evidence of hydronephrosis. No renal or ureteral stones are identified. Nonspecific perinephric stranding is noted bilaterally. Stomach/Bowel: The stomach is unremarkable in appearance. The small bowel is within normal limits. The appendix is normal in caliber, without evidence of appendicitis. The colon is unremarkable in appearance. Vascular/Lymphatic: Minimal calcification is seen along the abdominal aorta. No retroperitoneal or pelvic sidewall lymphadenopathy is seen. Reproductive: The bladder is mildly distended and grossly unremarkable. The uterus is grossly unremarkable. The ovaries are relatively symmetric. No suspicious adnexal masses are seen. Other: Diffuse soft tissue edema is seen along the abdominal and pelvic wall, likely reflecting anasarca. A small anterior abdominal wall hernia is seen just superior to the umbilicus, containing only fat. Musculoskeletal: No acute osseous abnormalities are identified. Vacuum phenomenon is noted at L5-S1. The visualized musculature is unremarkable in appearance. IMPRESSION: 1. Acute pancreatitis, with diffuse soft tissue inflammation about the pancreas, and small to moderate volume ascites within the abdomen and pelvis. No well defined pseudocyst seen. No definite evidence of devascularization at this time. 2. Small left and trace right pleural effusions. Bibasilar airspace opacities may reflect atelectasis or possibly  pneumonia. 3. Diffuse soft tissue edema along the abdominal and pelvic wall, likely reflecting anasarca. 4. Scattered coronary artery calcifications seen. 5. Diffuse fatty infiltration within the liver. 6. Small anterior abdominal wall hernia just superior to the umbilicus, containing only fat. Electronically Signed   By: Roanna Raider M.D.   On: 10/18/2016 22:29   Dg Chest Port 1  View  Result Date: 10/23/2016 CLINICAL DATA:  Shortness of Breath EXAM: PORTABLE CHEST 1 VIEW COMPARISON:  Chest radiograph October 13, 2016 and chest CT October 18, 2016 FINDINGS: There is persistent consolidation in the left base with small left pleural effusion. Lungs elsewhere clear. Heart is upper normal in size with pulmonary vascularity within normal limits. Central catheter tip is at the cavoatrial junction. No pneumothorax. No adenopathy. No bone lesions. IMPRESSION: Persistent left base consolidation with small left pleural effusion. Lungs elsewhere clear. Stable cardiac silhouette. No pneumothorax. Electronically Signed   By: Bretta Bang III M.D.   On: 10/23/2016 10:00   Dg Chest Port 1 View  Result Date: 10/13/2016 CLINICAL DATA:  Shortness of Breath EXAM: PORTABLE CHEST 1 VIEW COMPARISON:  10/12/2016 FINDINGS: Very low lung volumes with bilateral airspace opacities. Heart is borderline in size. No definite effusions. Right central line and NG tube are unchanged. IMPRESSION: Very low lung volumes with diffuse bilateral airspace disease, worsened since prior study. Electronically Signed   By: Charlett Nose M.D.   On: 10/13/2016 07:38   Dg Chest Port 1 View  Result Date: 10/12/2016 CLINICAL DATA:  Acute respiratory distress EXAM: PORTABLE CHEST 1 VIEW COMPARISON:  10/12/2016 FINDINGS: Right jugular central line and nasogastric catheter are noted in satisfactory position. Cardiac shadow is stable. The overall inspiratory effort is poor with persistent left basilar atelectasis. No new focal abnormality is seen. IMPRESSION: No change from the prior exam. Electronically Signed   By: Alcide Clever M.D.   On: 10/12/2016 18:32   Dg Abd 2 Views  Result Date: 11/02/2016 CLINICAL DATA:  Pancreatitis with pain EXAM: ABDOMEN - 2 VIEW COMPARISON:  CT abdomen and pelvis October 29, 2016 FINDINGS: Supine and upright images obtained. There is moderate stool throughout colon. There is no bowel dilatation  or air-fluid level suggesting bowel obstruction. No free air. Paucity of gas noted in the mid abdomen with slight of a large peripancreatic fluid collection. There is atelectasis in the left lung base. IMPRESSION: Paucity of gas in the mid abdomen at the site of a large peripancreatic fluid collection on recent CT. No bowel obstruction or free air. Atelectatic change noted left base. Electronically Signed   By: Bretta Bang III M.D.   On: 11/02/2016 11:17   Dg Abd Portable 1v  Result Date: 10/13/2016 CLINICAL DATA:  Patient with ileus.  NG tube placement. EXAM: PORTABLE ABDOMEN - 1 VIEW COMPARISON:  CT abdomen pelvis 10/11/2016. FINDINGS: Enteric tube tip and side-port project over the stomach. Gas is demonstrated within mildly dilated loops of small bowel within the central and left hemiabdomen. At lumbar spine degenerative changes. IMPRESSION: Enteric tube tip and side-port project over the stomach. Suspect mild ileus. Electronically Signed   By: Annia Belt M.D.   On: 10/13/2016 11:28     LOS: 3 days   Jeoffrey Massed, MD  [facility] Pager:[phone]  If 7PM-7AM, please contact night-coverage [email or web address] Password [email or web address] 11/02/2016, 1:27 PM

## 2016-11-03 ENCOUNTER — Inpatient Hospital Stay (HOSPITAL_COMMUNITY): Payer: BLUE CROSS/BLUE SHIELD

## 2016-11-03 DIAGNOSIS — K8500 Idiopathic acute pancreatitis without necrosis or infection: Secondary | ICD-10-CM

## 2016-11-03 LAB — CBC
HCT: 28.1 % — ABNORMAL LOW (ref 36.0–46.0)
HEMOGLOBIN: 8.9 g/dL — AB (ref 12.0–15.0)
MCH: 27.5 pg (ref 26.0–34.0)
MCHC: 31.7 g/dL (ref 30.0–36.0)
MCV: 86.7 fL (ref 78.0–100.0)
Platelets: 414 10*3/uL — ABNORMAL HIGH (ref 150–400)
RBC: 3.24 MIL/uL — AB (ref 3.87–5.11)
RDW: 14.1 % (ref 11.5–15.5)
WBC: 5.5 10*3/uL (ref 4.0–10.5)

## 2016-11-03 LAB — COMPREHENSIVE METABOLIC PANEL
ALK PHOS: 45 U/L (ref 38–126)
ALT: 20 U/L (ref 14–54)
AST: 26 U/L (ref 15–41)
Albumin: 2.9 g/dL — ABNORMAL LOW (ref 3.5–5.0)
Anion gap: 11 (ref 5–15)
BILIRUBIN TOTAL: 0.7 mg/dL (ref 0.3–1.2)
BUN: 9 mg/dL (ref 6–20)
CALCIUM: 9.5 mg/dL (ref 8.9–10.3)
CO2: 26 mmol/L (ref 22–32)
CREATININE: 0.9 mg/dL (ref 0.44–1.00)
Chloride: 100 mmol/L — ABNORMAL LOW (ref 101–111)
Glucose, Bld: 122 mg/dL — ABNORMAL HIGH (ref 65–99)
Potassium: 4 mmol/L (ref 3.5–5.1)
Sodium: 137 mmol/L (ref 135–145)
TOTAL PROTEIN: 7.3 g/dL (ref 6.5–8.1)

## 2016-11-03 LAB — GLUCOSE, CAPILLARY
GLUCOSE-CAPILLARY: 114 mg/dL — AB (ref 65–99)
Glucose-Capillary: 120 mg/dL — ABNORMAL HIGH (ref 65–99)
Glucose-Capillary: 130 mg/dL — ABNORMAL HIGH (ref 65–99)
Glucose-Capillary: 134 mg/dL — ABNORMAL HIGH (ref 65–99)
Glucose-Capillary: 141 mg/dL — ABNORMAL HIGH (ref 65–99)
Glucose-Capillary: 156 mg/dL — ABNORMAL HIGH (ref 65–99)

## 2016-11-03 LAB — LIPASE, BLOOD: Lipase: 33 U/L (ref 11–51)

## 2016-11-03 NOTE — Progress Notes (Signed)
Pt. tolerated her lunch ok and wanted to know if she can go home.  Text page Dr. Thedore MinsSingh to see.  Return called and stated if she does ok for the rest of the day, she can go home tomorrow.  Updated pt. And family with discharge plan.  Will continue to monitor.  [employer] [employer] Carmin Alvidrez, RN

## 2016-11-03 NOTE — Progress Notes (Signed)
PROGRESS NOTE        PATIENT DETAILS Name: Melissa Beasley Age: 36 y.o. Sex: female Date of Birth: Jan 13, 1981 Admit Date: 10/30/2016 Admitting Physician Eduard Clos, MD ONG:[id number], DANA, DO  Brief Narrative:  Patient is a 36 y.o. female with recent hospitalization from 10/12/16-10/24/16 with acute pancreatitis due to hypertriglyceridemia, she was managed conservatively and subsequently discharged home. Post discharge, she was doing well for her day or so, following which she started having worsening abdominal pain. She subsequently was evaluated at [facility] emergency room, where she was diagnosed with a large pseudocyst causing mass effect on the stomach. She was subsequently transferred to [facility] for further evaluation and treatment. See below for further details  Subjective:  Patient in bed, appears more comfortable, says abdominal pain has improved, no chest pain or shortness of breath.  Assessment/Plan: Evolving pseudocyst with mass effect on the stomach: Clearly likely due to recent pancreatitis. GI consulted-recommendations are to continue with supportive measures, and to repeat CT scan of the abdomen in 1-2 months. Likely improved, CMP and lipase unremarkable, abdominal exam does show mild tenderness, patient feels overall much better, will advance diet to soft and monitor. If pain continues we'll repeat CT abdomen although doubt she has any infection.   Recent pancreatitis due to hypertriglyceridemia: Entry on statin and fenofibrate continue. I guess writes down from 5000 to around 300.  Anemia: Probably related to prolonged acute illness, that of packed RBC earlier this admission now H&H stable will monitor. Outpatient GI and PCP follow-up.   Morbid obesity: Counseled.  Anxiety: Stable this morning-continue as needed Xanax.   Type 2 diabetes: CBGs appear stable-with SSI, resume oral hypoglycemic agents on discharge. A1c  10.3  CBG (last 3)   Recent Labs  11/03/16 0014 11/03/16 0415 11/03/16 0754  GLUCAP 134* 130* 120*     DVT Prophylaxis: We will start Lovenox as hemoglobin now stable. She previously was on SCDs.   Code Status: Full code   Family Communication: None at bedside  Disposition Plan: Remain inpatient-home in the next 1-2 days-if abdominal pain is controlled and diet advancement tolerated.Marland Kitchen  Antimicrobial agents: Anti-infectives    None      Procedures: None  CONSULTS:  GI  Time spent: 25- minutes-Greater than 50% of this time was spent in counseling, explanation of diagnosis, planning of further management, and coordination of care.  MEDICATIONS: Scheduled Meds: . atorvastatin  40 mg Oral q1800  . enoxaparin (LOVENOX) injection  40 mg Subcutaneous Q24H  . fenofibrate  160 mg Oral Daily  . insulin aspart  0-9 Units Subcutaneous Q4H  . lactulose  20 g Oral Once  . pantoprazole  40 mg Oral Q1200  . polyethylene glycol  17 g Oral Daily  . senna  2 tablet Oral QHS   Continuous Infusions:  PRN Meds:.albuterol, ALPRAZolam, bisacodyl, ondansetron **OR** ondansetron (ZOFRAN) IV, oxyCODONE-acetaminophen **AND** [DISCONTINUED] oxyCODONE   PHYSICAL EXAM: Vital signs: Vitals:   11/02/16 1412 11/02/16 2120 11/03/16 0421 11/03/16 0612  BP: (!) 133/92 138/88  113/83  Pulse: 92 91  84  Resp: 20 20  18   Temp: 98 F (36.7 C) 97.9 F (36.6 C)  98.3 F (36.8 C)  TempSrc:      SpO2: 99% 99%  98%  Weight:   87.5 kg (192 lb 14.4 oz)   Height:  Filed Weights   11/02/16 0009 11/02/16 0500 11/03/16 0421  Weight: 91.4 kg (201 lb 6.4 oz) 91.4 kg (201 lb 8 oz) 87.5 kg (192 lb 14.4 oz)   Body mass index is 31.13 kg/m.   General appearance :Awake, alert, not in any distress. Speech Clear. Eyes:, pupils equally reactive to light and accomodation,no scleral icterus. HEENT: Atraumatic and Normocephalic Neck: supple, no JVD. No cervical lymphadenopathy.  Resp:Good air  entry bilaterally, no added sounds  CVS: S1 S2 regular, no murmurs.  GI: Bowel sounds present, mildly tender in the periumbilical area without any peritoneal signs. Extremities: B/L Lower Ext shows no edema, both legs are warm to touch Neurology:  speech clear,Non focal, sensation is grossly intact. Psychiatric: Normal judgment and insight. Alert and oriented x 3.  Musculoskeletal:No digital cyanosis Skin:No Rash, warm and dry Wounds:N/A  I have personally reviewed following labs and imaging studies  LABORATORY DATA: CBC:  Recent Labs Lab 10/30/16 0430 10/31/16 0430 10/31/16 1347 10/31/16 2049 11/02/16 0614 11/03/16 0850  WBC 10.9* 6.9  --  6.3 6.2 5.5  NEUTROABS 7.0  --   --   --   --   --   HGB 7.6* 6.7* 7.9* 8.4* 8.7* 8.9*  HCT 24.5* 21.8* 25.2* 26.8* 26.9* 28.1*  MCV 88.4 89.0  --  87.3 87.1 86.7  PLT 524* 434*  --  460* 427* 414*    Basic Metabolic Panel:  Recent Labs Lab 10/30/16 0430 10/31/16 0430 11/02/16 1121 11/03/16 0850  NA 138 137 137 137  K 4.4 4.2 3.8 4.0  CL 102 104 102 100*  CO2 26 24 23 26   GLUCOSE 181* 136* 150* 122*  BUN 15 13 11 9   CREATININE 1.23* 0.93 0.90 0.90  CALCIUM 8.9 8.8* 9.1 9.5  MG  --  1.9  --   --     GFR: Estimated Creatinine Clearance: 97.2 mL/min (by C-G formula based on SCr of 0.9 mg/dL).  Liver Function Tests:  Recent Labs Lab 10/30/16 0430 10/31/16 0430 11/02/16 1121 11/03/16 0850  AST 27 25 23 26   ALT 26 23 21 20   ALKPHOS 49 41 46 45  BILITOT 0.4 0.7 0.6 0.7  PROT 6.8 6.0* 6.9 7.3  ALBUMIN 2.6* 2.3* 2.7* 2.9*    Recent Labs Lab 10/30/16 0430 11/03/16 0850  LIPASE 36 33   No results for input(s): AMMONIA in the last 168 hours.  Coagulation Profile: No results for input(s): INR, PROTIME in the last 168 hours.  Cardiac Enzymes: No results for input(s): CKTOTAL, CKMB, CKMBINDEX, TROPONINI in the last 168 hours.  BNP (last 3 results) No results for input(s): PROBNP in the last 8760  hours.  HbA1C: No results for input(s): HGBA1C in the last 72 hours.  CBG:  Recent Labs Lab 11/02/16 1631 11/02/16 1950 11/03/16 0014 11/03/16 0415 11/03/16 0754  GLUCAP 130* 163* 134* 130* 120*    Lipid Profile: No results for input(s): CHOL, HDL, LDLCALC, TRIG, CHOLHDL, LDLDIRECT in the last 72 hours.  Thyroid Function Tests: No results for input(s): TSH, T4TOTAL, FREET4, T3FREE, THYROIDAB in the last 72 hours.  Anemia Panel: No results for input(s): VITAMINB12, FOLATE, FERRITIN, TIBC, IRON, RETICCTPCT in the last 72 hours.  Urine analysis:    Component Value Date/Time   COLORURINE YELLOW 10/18/2016 1745   APPEARANCEUR HAZY (A) 10/18/2016 1745   LABSPEC 1.012 10/18/2016 1745   PHURINE 6.0 10/18/2016 1745   GLUCOSEU 50 (A) 10/18/2016 1745   HGBUR LARGE (A) 10/18/2016 1745   BILIRUBINUR  NEGATIVE 10/18/2016 1745   KETONESUR NEGATIVE 10/18/2016 1745   PROTEINUR 30 (A) 10/18/2016 1745   NITRITE NEGATIVE 10/18/2016 1745   LEUKOCYTESUR NEGATIVE 10/18/2016 1745    Sepsis Labs: Lactic Acid, Venous    Component Value Date/Time   LATICACIDVEN 0.8 10/30/2016 0430    MICROBIOLOGY: No results found for this or any previous visit (from the past 240 hour(s)).  RADIOLOGY STUDIES/RESULTS: Ct Chest W Contrast  Result Date: 10/18/2016 CLINICAL DATA:  Concern for sepsis in patient with pancreatitis. Further evaluation requested. Initial encounter. EXAM: CT CHEST, ABDOMEN, AND PELVIS WITH CONTRAST TECHNIQUE: Multidetector CT imaging of the chest, abdomen and pelvis was performed following the standard protocol during bolus administration of intravenous contrast. CONTRAST:  100 mL ISOVUE-300 IOPAMIDOL (ISOVUE-300) INJECTION 61% COMPARISON:  CT of the abdomen and pelvis performed 10/11/2016, and chest radiograph performed 10/13/2016 FINDINGS: CT CHEST FINDINGS Cardiovascular: The heart is normal in size. Scattered coronary artery calcifications are seen. A right IJ line is noted  ending about the distal SVC. The thoracic aorta is grossly unremarkable. The great vessels are grossly unremarkable. Mediastinum/Nodes: The mediastinum is grossly unremarkable in appearance. No mediastinal lymphadenopathy is seen. No pericardial effusion is identified. The visualized portions of the thyroid gland are unremarkable. No axillary lymphadenopathy is seen. A small nodule at the right breast is grossly stable from 2008 and likely reflects normal fibroglandular tissue. Lungs/Pleura: Small left and trace right pleural effusions are noted. Bibasilar airspace opacities may reflect atelectasis or possibly pneumonia. No pneumothorax is seen. No dominant mass is identified. Musculoskeletal: No acute osseous abnormalities are identified. The visualized musculature is unremarkable in appearance. CT ABDOMEN PELVIS FINDINGS Hepatobiliary: There is diffuse fatty infiltration within the liver. The gallbladder is partially filled with contrast. The common bile duct is not well characterized. Pancreas: There is diffuse soft tissue inflammation about the pancreas, with associated fluid tracking about the stomach, small bowel loops, liver, spleen, and along both paracolic gutters into the pelvis. This is compatible with relatively severe acute pancreatitis. No well defined pseudocyst is seen. There is no definite evidence of devascularization at this time. Spleen: The spleen is grossly unremarkable in appearance. Adrenals/Urinary Tract: The adrenal glands are unremarkable in appearance. The kidneys are within normal limits. There is no evidence of hydronephrosis. No renal or ureteral stones are identified. Nonspecific perinephric stranding is noted bilaterally. Stomach/Bowel: The stomach is unremarkable in appearance. The small bowel is within normal limits. The appendix is normal in caliber, without evidence of appendicitis. The colon is unremarkable in appearance. Vascular/Lymphatic: Minimal calcification is seen along  the abdominal aorta. No retroperitoneal or pelvic sidewall lymphadenopathy is seen. Reproductive: The bladder is mildly distended and grossly unremarkable. The uterus is grossly unremarkable. The ovaries are relatively symmetric. No suspicious adnexal masses are seen. Other: Diffuse soft tissue edema is seen along the abdominal and pelvic wall, likely reflecting anasarca. A small anterior abdominal wall hernia is seen just superior to the umbilicus, containing only fat. Musculoskeletal: No acute osseous abnormalities are identified. Vacuum phenomenon is noted at L5-S1. The visualized musculature is unremarkable in appearance. IMPRESSION: 1. Acute pancreatitis, with diffuse soft tissue inflammation about the pancreas, and small to moderate volume ascites within the abdomen and pelvis. No well defined pseudocyst seen. No definite evidence of devascularization at this time. 2. Small left and trace right pleural effusions. Bibasilar airspace opacities may reflect atelectasis or possibly pneumonia. 3. Diffuse soft tissue edema along the abdominal and pelvic wall, likely reflecting anasarca. 4. Scattered coronary artery calcifications  seen. 5. Diffuse fatty infiltration within the liver. 6. Small anterior abdominal wall hernia just superior to the umbilicus, containing only fat. Electronically Signed   By: Roanna Raider M.D.   On: 10/18/2016 22:29   Ct Abdomen Pelvis W Contrast  Result Date: 10/18/2016 CLINICAL DATA:  Concern for sepsis in patient with pancreatitis. Further evaluation requested. Initial encounter. EXAM: CT CHEST, ABDOMEN, AND PELVIS WITH CONTRAST TECHNIQUE: Multidetector CT imaging of the chest, abdomen and pelvis was performed following the standard protocol during bolus administration of intravenous contrast. CONTRAST:  100 mL ISOVUE-300 IOPAMIDOL (ISOVUE-300) INJECTION 61% COMPARISON:  CT of the abdomen and pelvis performed 10/11/2016, and chest radiograph performed 10/13/2016 FINDINGS: CT CHEST  FINDINGS Cardiovascular: The heart is normal in size. Scattered coronary artery calcifications are seen. A right IJ line is noted ending about the distal SVC. The thoracic aorta is grossly unremarkable. The great vessels are grossly unremarkable. Mediastinum/Nodes: The mediastinum is grossly unremarkable in appearance. No mediastinal lymphadenopathy is seen. No pericardial effusion is identified. The visualized portions of the thyroid gland are unremarkable. No axillary lymphadenopathy is seen. A small nodule at the right breast is grossly stable from 2008 and likely reflects normal fibroglandular tissue. Lungs/Pleura: Small left and trace right pleural effusions are noted. Bibasilar airspace opacities may reflect atelectasis or possibly pneumonia. No pneumothorax is seen. No dominant mass is identified. Musculoskeletal: No acute osseous abnormalities are identified. The visualized musculature is unremarkable in appearance. CT ABDOMEN PELVIS FINDINGS Hepatobiliary: There is diffuse fatty infiltration within the liver. The gallbladder is partially filled with contrast. The common bile duct is not well characterized. Pancreas: There is diffuse soft tissue inflammation about the pancreas, with associated fluid tracking about the stomach, small bowel loops, liver, spleen, and along both paracolic gutters into the pelvis. This is compatible with relatively severe acute pancreatitis. No well defined pseudocyst is seen. There is no definite evidence of devascularization at this time. Spleen: The spleen is grossly unremarkable in appearance. Adrenals/Urinary Tract: The adrenal glands are unremarkable in appearance. The kidneys are within normal limits. There is no evidence of hydronephrosis. No renal or ureteral stones are identified. Nonspecific perinephric stranding is noted bilaterally. Stomach/Bowel: The stomach is unremarkable in appearance. The small bowel is within normal limits. The appendix is normal in caliber,  without evidence of appendicitis. The colon is unremarkable in appearance. Vascular/Lymphatic: Minimal calcification is seen along the abdominal aorta. No retroperitoneal or pelvic sidewall lymphadenopathy is seen. Reproductive: The bladder is mildly distended and grossly unremarkable. The uterus is grossly unremarkable. The ovaries are relatively symmetric. No suspicious adnexal masses are seen. Other: Diffuse soft tissue edema is seen along the abdominal and pelvic wall, likely reflecting anasarca. A small anterior abdominal wall hernia is seen just superior to the umbilicus, containing only fat. Musculoskeletal: No acute osseous abnormalities are identified. Vacuum phenomenon is noted at L5-S1. The visualized musculature is unremarkable in appearance. IMPRESSION: 1. Acute pancreatitis, with diffuse soft tissue inflammation about the pancreas, and small to moderate volume ascites within the abdomen and pelvis. No well defined pseudocyst seen. No definite evidence of devascularization at this time. 2. Small left and trace right pleural effusions. Bibasilar airspace opacities may reflect atelectasis or possibly pneumonia. 3. Diffuse soft tissue edema along the abdominal and pelvic wall, likely reflecting anasarca. 4. Scattered coronary artery calcifications seen. 5. Diffuse fatty infiltration within the liver. 6. Small anterior abdominal wall hernia just superior to the umbilicus, containing only fat. Electronically Signed   By: Roanna Raider  M.D.   On: 10/18/2016 22:29   Dg Chest Port 1 View  Result Date: 10/23/2016 CLINICAL DATA:  Shortness of Breath EXAM: PORTABLE CHEST 1 VIEW COMPARISON:  Chest radiograph October 13, 2016 and chest CT October 18, 2016 FINDINGS: There is persistent consolidation in the left base with small left pleural effusion. Lungs elsewhere clear. Heart is upper normal in size with pulmonary vascularity within normal limits. Central catheter tip is at the cavoatrial junction. No  pneumothorax. No adenopathy. No bone lesions. IMPRESSION: Persistent left base consolidation with small left pleural effusion. Lungs elsewhere clear. Stable cardiac silhouette. No pneumothorax. Electronically Signed   By: Bretta Bang III M.D.   On: 10/23/2016 10:00   Dg Chest Port 1 View  Result Date: 10/13/2016 CLINICAL DATA:  Shortness of Breath EXAM: PORTABLE CHEST 1 VIEW COMPARISON:  10/12/2016 FINDINGS: Very low lung volumes with bilateral airspace opacities. Heart is borderline in size. No definite effusions. Right central line and NG tube are unchanged. IMPRESSION: Very low lung volumes with diffuse bilateral airspace disease, worsened since prior study. Electronically Signed   By: Charlett Nose M.D.   On: 10/13/2016 07:38   Dg Chest Port 1 View  Result Date: 10/12/2016 CLINICAL DATA:  Acute respiratory distress EXAM: PORTABLE CHEST 1 VIEW COMPARISON:  10/12/2016 FINDINGS: Right jugular central line and nasogastric catheter are noted in satisfactory position. Cardiac shadow is stable. The overall inspiratory effort is poor with persistent left basilar atelectasis. No new focal abnormality is seen. IMPRESSION: No change from the prior exam. Electronically Signed   By: Alcide Clever M.D.   On: 10/12/2016 18:32   Dg Abd 2 Views  Result Date: 11/02/2016 CLINICAL DATA:  Pancreatitis with pain EXAM: ABDOMEN - 2 VIEW COMPARISON:  CT abdomen and pelvis October 29, 2016 FINDINGS: Supine and upright images obtained. There is moderate stool throughout colon. There is no bowel dilatation or air-fluid level suggesting bowel obstruction. No free air. Paucity of gas noted in the mid abdomen with slight of a large peripancreatic fluid collection. There is atelectasis in the left lung base. IMPRESSION: Paucity of gas in the mid abdomen at the site of a large peripancreatic fluid collection on recent CT. No bowel obstruction or free air. Atelectatic change noted left base. Electronically Signed   By: Bretta Bang III M.D.   On: 11/02/2016 11:17   Dg Abd Portable 1v  Result Date: 11/03/2016 CLINICAL DATA:  Abdominal pain.  History of acute pancreatitis. EXAM: PORTABLE ABDOMEN - 1 VIEW COMPARISON:  November 03, 2015 FINDINGS: No bowel dilatation or air-fluid level suggesting bowel obstruction. No free air. Relative paucity of gas in the upper abdomen in the area of the recently demonstrated peripancreatic fluid collection. Small phleboliths noted in pelvis. IMPRESSION: No bowel obstruction or free air. Paucity of gas in upper abdomen, likely due to extensive peripancreatic fluid in this area. Electronically Signed   By: Bretta Bang III M.D.   On: 11/03/2016 07:05   Dg Abd Portable 1v  Result Date: 10/13/2016 CLINICAL DATA:  Patient with ileus.  NG tube placement. EXAM: PORTABLE ABDOMEN - 1 VIEW COMPARISON:  CT abdomen pelvis 10/11/2016. FINDINGS: Enteric tube tip and side-port project over the stomach. Gas is demonstrated within mildly dilated loops of small bowel within the central and left hemiabdomen. At lumbar spine degenerative changes. IMPRESSION: Enteric tube tip and side-port project over the stomach. Suspect mild ileus. Electronically Signed   By: Annia Belt M.D.   On: 10/13/2016 11:28  LOS: 4 days   Leroy Sea, MD  [facility] Pager:[phone]  If 7PM-7AM, please contact night-coverage [email or web address] Password [facility] 11/03/2016, 10:29 AM

## 2016-11-04 LAB — GLUCOSE, CAPILLARY
Glucose-Capillary: 125 mg/dL — ABNORMAL HIGH (ref 65–99)
Glucose-Capillary: 136 mg/dL — ABNORMAL HIGH (ref 65–99)
Glucose-Capillary: 137 mg/dL — ABNORMAL HIGH (ref 65–99)

## 2016-11-04 MED ORDER — DOCUSATE SODIUM 100 MG PO CAPS: 200.0000 mg | ORAL_CAPSULE | Freq: Two times a day (BID) | ORAL | 0 refills | Status: AC

## 2016-11-04 MED ORDER — SORBITOL 70 % SOLN
960.0000 mL | TOPICAL_OIL | Freq: Once | ORAL | Status: AC
  Administered 2016-11-04: 960 mL via RECTAL
  Filled 2016-11-04: qty 240

## 2016-11-04 MED ORDER — ATORVASTATIN CALCIUM 40 MG PO TABS: 40.0000 mg | ORAL_TABLET | Freq: Every day | ORAL | 0 refills | Status: AC

## 2016-11-04 MED ORDER — SENNA 8.6 MG PO TABS: 2.0000 | ORAL_TABLET | Freq: Two times a day (BID) | ORAL | 0 refills | Status: DC

## 2016-11-04 NOTE — Discharge Summary (Signed)
Melissa Beasley:[id number] DOB: 1980/11/08 DOA: 10/30/2016  PCP: Irena Reichmann, DO  Admit date: 10/30/2016  Discharge date: 11/04/2016  Admitted From: Home  Disposition:  Home   Recommendations for Outpatient Follow-up:   Follow up with PCP in 1-2 weeks  PCP Please obtain BMP/CBC, 2 view CXR in 1week,  (see Discharge instructions)   PCP Please follow up on the following pending results: None   Home Health: None   Equipment/Devices: None  Consultations: GI Discharge Condition: Stable   CODE STATUS: Full   Diet Recommendation: DIET SOFT Heart Healthy Low  Carb for 1 week then advance gradually to Regular Heart Healthy Low Carb.   No chief complaint on file.    Brief history of present illness from the day of admission and additional interim summary    Patient is a 36 y.o. female with recent hospitalization from 10/12/16-10/24/16 with acute pancreatitis due to hypertriglyceridemia, she was managed conservatively and subsequently discharged home. Post discharge, she was doing well for her day or so, following which she started having worsening abdominal pain. She subsequently was evaluated at [facility] emergency room, where she was diagnosed with a large pseudocyst causing mass effect on the stomach. She was subsequently transferred to [facility] for further evaluation and treatment. See below for further details                                                                  Hospital Course   Evolving pseudocyst with mass effect on the stomach: Due to recent pancreatitis. GI consulted-recommendations are to continue with supportive measures, and to repeat CT scan of the abdomen in 1-2 months. Clinically much improved and tolerating diet, eager to go home, CMP and lipase unremarkable, abdominal  exam does now shows minimal epigastric tenderness, patient feels overall much better, we'll discharge her home with outpatient PCP and GI follow-up. Patient told to return to ER if she spikes a fever or if pain gets worse indicating pseudocyst infection.   Recent pancreatitis due to hypertriglyceridemia: Continue on statin and fenofibrate continue. Triglyceride levels down from 5000 to around 300.  Anemia: Probably related to prolonged acute illness,  she received a unit of packed RBC earlier this admission now H&H stable, request PCP to continue to monitor. Outpatient GI and PCP follow-up.   Morbid obesity: Counseled on follow with PCP for weight loss.  Type 2 diabetes: Continue home regimen and follow with PCP for CBG control. A1c 10.3  Constipation. Placed on bowel regimen, will also get SMOG enema before discharge, KUB nonacute.  Discharge diagnosis     Active Problems:   Pancreatitis, acute   Type 2 diabetes mellitus with hyperglycemia (HCC)   Hypertriglyceridemia   Normocytic normochromic anemia   Acute pancreatitis    Discharge instructions  Discharge Instructions    Discharge instructions    Complete by:  As directed    Follow with Primary MD COLLINS, DANA, DO in 7 days   Get CBC, CMP, 2 view Chest X ray checked  by Primary MD or SNF MD in 5-7 days ( we routinely change or add medications that can affect your baseline labs and fluid status, therefore we recommend that you get the mentioned basic workup next visit with your PCP, your PCP may decide not to get them or add new tests based on their clinical decision)  Activity: As tolerated with Full fall precautions use walker/cane & assistance as needed.  Disposition Home    Diet:   DIET SOFT Heart Healthy Low  Carb for 1 week then advance gradually to Regular Heart Healthy Low Carb.  Accuchecks 4 times/day, Once in AM empty stomach and then before each meal. Log in all results and show them to your Prim.MD in 3  days. If any glucose reading is under 80 or above 300 call your Prim MD immidiately. Follow Low glucose instructions for glucose under 80 as instructed.   For Heart failure patients - Check your Weight same time everyday, if you gain over 2 pounds, or you develop in leg swelling, experience more shortness of breath or chest pain, call your Primary MD immediately. Follow Cardiac Low Salt Diet and 1.5 lit/day fluid restriction.  On your next visit with your primary care physician please Get Medicines reviewed and adjusted.  Please request your Prim.MD to go over all Hospital Tests and Procedure/Radiological results at the follow up, please get all Hospital records sent to your Prim MD by signing hospital release before you go home.  If you experience worsening of your admission symptoms, develop shortness of breath, life threatening emergency, suicidal or homicidal thoughts you must seek medical attention immediately by calling 911 or calling your MD immediately  if symptoms less severe.  You Must read complete instructions/literature along with all the possible adverse reactions/side effects for all the Medicines you take and that have been prescribed to you. Take any new Medicines after you have completely understood and accpet all the possible adverse reactions/side effects.   Do not drive, operate heavy machinery, perform activities at heights, swimming or participation in water activities or provide baby sitting services if your were admitted for syncope or siezures until you have seen by Primary MD or a Neurologist and advised to do so again.  Do not drive when taking Pain medications.    Do not take more than prescribed Pain, Sleep and Anxiety Medications  Special Instructions: If you have smoked or chewed Tobacco  in the last 2 yrs please stop smoking, stop any regular Alcohol  and or any Recreational drug use.  Wear Seat belts while driving.   Please note  You were cared for by a  hospitalist during your hospital stay. If you have any questions about your discharge medications or the care you received while you were in the hospital after you are discharged, you can call the unit and asked to speak with the hospitalist on call if the hospitalist that took care of you is not available. Once you are discharged, your primary care physician will handle any further medical issues. Please note that NO REFILLS for any discharge medications will be authorized once you are discharged, as it is imperative that you return to your primary care physician (or establish a relationship with a primary care physician if you do  not have one) for your aftercare needs so that they can reassess your need for medications and monitor your lab values.   Increase activity slowly    Complete by:  As directed       Discharge Medications   Allergies as of 11/04/2016   No Known Allergies     Medication List    STOP taking these medications   furosemide 20 MG tablet Commonly known as:  LASIX     TAKE these medications   atorvastatin 40 MG tablet Commonly known as:  LIPITOR Take 1 tablet (40 mg total) by mouth daily at 6 PM.   docusate sodium 100 MG capsule Commonly known as:  COLACE Take 2 capsules (200 mg total) by mouth 2 (two) times daily.   fenofibrate 145 MG tablet Commonly known as:  TRICOR Take 1 tablet (145 mg total) by mouth daily.   gemfibrozil 600 MG tablet Commonly known as:  LOPID Take 1 tablet (600 mg total) by mouth 2 (two) times daily before a meal.   glipiZIDE 2.5 MG 24 hr tablet Commonly known as:  GLUCOTROL XL Take 2.5 mg by mouth daily.   oxyCODONE-acetaminophen 5-325 MG tablet Commonly known as:  PERCOCET/ROXICET Take 1-2 tablets by mouth every 4 (four) hours as needed for severe pain.   pantoprazole 20 MG tablet Commonly known as:  PROTONIX Take 20 mg by mouth daily.   potassium chloride 10 MEQ tablet Commonly known as:  K-DUR,KLOR-CON Take 10 mEq by  mouth daily.   promethazine 12.5 MG tablet Commonly known as:  PHENERGAN Take 12.5 mg by mouth every 6 (six) hours as needed for nausea/vomiting.   senna 8.6 MG Tabs tablet Commonly known as:  SENOKOT Take 2 tablets (17.2 mg total) by mouth 2 (two) times daily.       Follow-up Information    COLLINS, DANA, DO. Schedule an appointment as soon as possible for a visit in 1 week(s).   Specialty:  Family Medicine Contact information: [address] [place] [address] [phone]        Marsa Aris, MD. Schedule an appointment as soon as possible for a visit in 1 week(s).   Specialty:  Gastroenterology Contact information: [address] [place] [address] [phone]           Major procedures and Radiology Reports - PLEASE review detailed and final reports thoroughly  -        Ct Chest W Contrast  Result Date: 10/18/2016 CLINICAL DATA:  Concern for sepsis in patient with pancreatitis. Further evaluation requested. Initial encounter. EXAM: CT CHEST, ABDOMEN, AND PELVIS WITH CONTRAST TECHNIQUE: Multidetector CT imaging of the chest, abdomen and pelvis was performed following the standard protocol during bolus administration of intravenous contrast. CONTRAST:  100 mL ISOVUE-300 IOPAMIDOL (ISOVUE-300) INJECTION 61% COMPARISON:  CT of the abdomen and pelvis performed 10/11/2016, and chest radiograph performed 10/13/2016 FINDINGS: CT CHEST FINDINGS Cardiovascular: The heart is normal in size. Scattered coronary artery calcifications are seen. A right IJ line is noted ending about the distal SVC. The thoracic aorta is grossly unremarkable. The great vessels are grossly unremarkable. Mediastinum/Nodes: The mediastinum is grossly unremarkable in appearance. No mediastinal lymphadenopathy is seen. No pericardial effusion is identified. The visualized portions of the thyroid gland are unremarkable. No axillary lymphadenopathy is seen. A small nodule at the right  breast is grossly stable from 2008 and likely reflects normal fibroglandular tissue. Lungs/Pleura: Small left and trace right pleural effusions are noted. Bibasilar  airspace opacities may reflect atelectasis or possibly pneumonia. No pneumothorax is seen. No dominant mass is identified. Musculoskeletal: No acute osseous abnormalities are identified. The visualized musculature is unremarkable in appearance. CT ABDOMEN PELVIS FINDINGS Hepatobiliary: There is diffuse fatty infiltration within the liver. The gallbladder is partially filled with contrast. The common bile duct is not well characterized. Pancreas: There is diffuse soft tissue inflammation about the pancreas, with associated fluid tracking about the stomach, small bowel loops, liver, spleen, and along both paracolic gutters into the pelvis. This is compatible with relatively severe acute pancreatitis. No well defined pseudocyst is seen. There is no definite evidence of devascularization at this time. Spleen: The spleen is grossly unremarkable in appearance. Adrenals/Urinary Tract: The adrenal glands are unremarkable in appearance. The kidneys are within normal limits. There is no evidence of hydronephrosis. No renal or ureteral stones are identified. Nonspecific perinephric stranding is noted bilaterally. Stomach/Bowel: The stomach is unremarkable in appearance. The small bowel is within normal limits. The appendix is normal in caliber, without evidence of appendicitis. The colon is unremarkable in appearance. Vascular/Lymphatic: Minimal calcification is seen along the abdominal aorta. No retroperitoneal or pelvic sidewall lymphadenopathy is seen. Reproductive: The bladder is mildly distended and grossly unremarkable. The uterus is grossly unremarkable. The ovaries are relatively symmetric. No suspicious adnexal masses are seen. Other: Diffuse soft tissue edema is seen along the abdominal and pelvic wall, likely reflecting anasarca. A small anterior  abdominal wall hernia is seen just superior to the umbilicus, containing only fat. Musculoskeletal: No acute osseous abnormalities are identified. Vacuum phenomenon is noted at L5-S1. The visualized musculature is unremarkable in appearance. IMPRESSION: 1. Acute pancreatitis, with diffuse soft tissue inflammation about the pancreas, and small to moderate volume ascites within the abdomen and pelvis. No well defined pseudocyst seen. No definite evidence of devascularization at this time. 2. Small left and trace right pleural effusions. Bibasilar airspace opacities may reflect atelectasis or possibly pneumonia. 3. Diffuse soft tissue edema along the abdominal and pelvic wall, likely reflecting anasarca. 4. Scattered coronary artery calcifications seen. 5. Diffuse fatty infiltration within the liver. 6. Small anterior abdominal wall hernia just superior to the umbilicus, containing only fat. Electronically Signed   By: Roanna Raider M.D.   On: 10/18/2016 22:29   Ct Abdomen Pelvis W Contrast  Result Date: 10/18/2016 CLINICAL DATA:  Concern for sepsis in patient with pancreatitis. Further evaluation requested. Initial encounter. EXAM: CT CHEST, ABDOMEN, AND PELVIS WITH CONTRAST TECHNIQUE: Multidetector CT imaging of the chest, abdomen and pelvis was performed following the standard protocol during bolus administration of intravenous contrast. CONTRAST:  100 mL ISOVUE-300 IOPAMIDOL (ISOVUE-300) INJECTION 61% COMPARISON:  CT of the abdomen and pelvis performed 10/11/2016, and chest radiograph performed 10/13/2016 FINDINGS: CT CHEST FINDINGS Cardiovascular: The heart is normal in size. Scattered coronary artery calcifications are seen. A right IJ line is noted ending about the distal SVC. The thoracic aorta is grossly unremarkable. The great vessels are grossly unremarkable. Mediastinum/Nodes: The mediastinum is grossly unremarkable in appearance. No mediastinal lymphadenopathy is seen. No pericardial effusion is  identified. The visualized portions of the thyroid gland are unremarkable. No axillary lymphadenopathy is seen. A small nodule at the right breast is grossly stable from 2008 and likely reflects normal fibroglandular tissue. Lungs/Pleura: Small left and trace right pleural effusions are noted. Bibasilar airspace opacities may reflect atelectasis or possibly pneumonia. No pneumothorax is seen. No dominant mass is identified. Musculoskeletal: No acute osseous abnormalities are identified. The visualized musculature is unremarkable in  appearance. CT ABDOMEN PELVIS FINDINGS Hepatobiliary: There is diffuse fatty infiltration within the liver. The gallbladder is partially filled with contrast. The common bile duct is not well characterized. Pancreas: There is diffuse soft tissue inflammation about the pancreas, with associated fluid tracking about the stomach, small bowel loops, liver, spleen, and along both paracolic gutters into the pelvis. This is compatible with relatively severe acute pancreatitis. No well defined pseudocyst is seen. There is no definite evidence of devascularization at this time. Spleen: The spleen is grossly unremarkable in appearance. Adrenals/Urinary Tract: The adrenal glands are unremarkable in appearance. The kidneys are within normal limits. There is no evidence of hydronephrosis. No renal or ureteral stones are identified. Nonspecific perinephric stranding is noted bilaterally. Stomach/Bowel: The stomach is unremarkable in appearance. The small bowel is within normal limits. The appendix is normal in caliber, without evidence of appendicitis. The colon is unremarkable in appearance. Vascular/Lymphatic: Minimal calcification is seen along the abdominal aorta. No retroperitoneal or pelvic sidewall lymphadenopathy is seen. Reproductive: The bladder is mildly distended and grossly unremarkable. The uterus is grossly unremarkable. The ovaries are relatively symmetric. No suspicious adnexal masses  are seen. Other: Diffuse soft tissue edema is seen along the abdominal and pelvic wall, likely reflecting anasarca. A small anterior abdominal wall hernia is seen just superior to the umbilicus, containing only fat. Musculoskeletal: No acute osseous abnormalities are identified. Vacuum phenomenon is noted at L5-S1. The visualized musculature is unremarkable in appearance. IMPRESSION: 1. Acute pancreatitis, with diffuse soft tissue inflammation about the pancreas, and small to moderate volume ascites within the abdomen and pelvis. No well defined pseudocyst seen. No definite evidence of devascularization at this time. 2. Small left and trace right pleural effusions. Bibasilar airspace opacities may reflect atelectasis or possibly pneumonia. 3. Diffuse soft tissue edema along the abdominal and pelvic wall, likely reflecting anasarca. 4. Scattered coronary artery calcifications seen. 5. Diffuse fatty infiltration within the liver. 6. Small anterior abdominal wall hernia just superior to the umbilicus, containing only fat. Electronically Signed   By: Roanna Raider M.D.   On: 10/18/2016 22:29   Dg Chest Port 1 View  Result Date: 10/23/2016 CLINICAL DATA:  Shortness of Breath EXAM: PORTABLE CHEST 1 VIEW COMPARISON:  Chest radiograph October 13, 2016 and chest CT October 18, 2016 FINDINGS: There is persistent consolidation in the left base with small left pleural effusion. Lungs elsewhere clear. Heart is upper normal in size with pulmonary vascularity within normal limits. Central catheter tip is at the cavoatrial junction. No pneumothorax. No adenopathy. No bone lesions. IMPRESSION: Persistent left base consolidation with small left pleural effusion. Lungs elsewhere clear. Stable cardiac silhouette. No pneumothorax. Electronically Signed   By: Bretta Bang III M.D.   On: 10/23/2016 10:00   Dg Chest Port 1 View  Result Date: 10/13/2016 CLINICAL DATA:  Shortness of Breath EXAM: PORTABLE CHEST 1 VIEW COMPARISON:   10/12/2016 FINDINGS: Very low lung volumes with bilateral airspace opacities. Heart is borderline in size. No definite effusions. Right central line and NG tube are unchanged. IMPRESSION: Very low lung volumes with diffuse bilateral airspace disease, worsened since prior study. Electronically Signed   By: Charlett Nose M.D.   On: 10/13/2016 07:38   Dg Chest Port 1 View  Result Date: 10/12/2016 CLINICAL DATA:  Acute respiratory distress EXAM: PORTABLE CHEST 1 VIEW COMPARISON:  10/12/2016 FINDINGS: Right jugular central line and nasogastric catheter are noted in satisfactory position. Cardiac shadow is stable. The overall inspiratory effort is poor with persistent left  basilar atelectasis. No new focal abnormality is seen. IMPRESSION: No change from the prior exam. Electronically Signed   By: Alcide Clever M.D.   On: 10/12/2016 18:32   Dg Abd 2 Views  Result Date: 11/02/2016 CLINICAL DATA:  Pancreatitis with pain EXAM: ABDOMEN - 2 VIEW COMPARISON:  CT abdomen and pelvis October 29, 2016 FINDINGS: Supine and upright images obtained. There is moderate stool throughout colon. There is no bowel dilatation or air-fluid level suggesting bowel obstruction. No free air. Paucity of gas noted in the mid abdomen with slight of a large peripancreatic fluid collection. There is atelectasis in the left lung base. IMPRESSION: Paucity of gas in the mid abdomen at the site of a large peripancreatic fluid collection on recent CT. No bowel obstruction or free air. Atelectatic change noted left base. Electronically Signed   By: Bretta Bang III M.D.   On: 11/02/2016 11:17   Dg Abd Portable 1v  Result Date: 11/03/2016 CLINICAL DATA:  Abdominal pain.  History of acute pancreatitis. EXAM: PORTABLE ABDOMEN - 1 VIEW COMPARISON:  November 03, 2015 FINDINGS: No bowel dilatation or air-fluid level suggesting bowel obstruction. No free air. Relative paucity of gas in the upper abdomen in the area of the recently demonstrated  peripancreatic fluid collection. Small phleboliths noted in pelvis. IMPRESSION: No bowel obstruction or free air. Paucity of gas in upper abdomen, likely due to extensive peripancreatic fluid in this area. Electronically Signed   By: Bretta Bang III M.D.   On: 11/03/2016 07:05   Dg Abd Portable 1v  Result Date: 10/13/2016 CLINICAL DATA:  Patient with ileus.  NG tube placement. EXAM: PORTABLE ABDOMEN - 1 VIEW COMPARISON:  CT abdomen pelvis 10/11/2016. FINDINGS: Enteric tube tip and side-port project over the stomach. Gas is demonstrated within mildly dilated loops of small bowel within the central and left hemiabdomen. At lumbar spine degenerative changes. IMPRESSION: Enteric tube tip and side-port project over the stomach. Suspect mild ileus. Electronically Signed   By: Annia Belt M.D.   On: 10/13/2016 11:28    Micro Results    No results found for this or any previous visit (from the past 240 hour(s)).  Today   Subjective    Melissa Beasley today has no headache,no chest abdominal pain,no new weakness tingling or numbness, feels much better wants to go home today.    Objective   Blood pressure (!) 141/98, pulse 94, temperature 98 F (36.7 C), temperature source Oral, resp. rate 18, height 5\' 6"  (1.676 m), weight 87.5 kg (192 lb 14.4 oz), last menstrual period 10/22/2016, SpO2 96 %.   Intake/Output Summary (Last 24 hours) at 11/04/16 0912 Last data filed at 11/04/16 0600  Gross per 24 hour  Intake              680 ml  Output                0 ml  Net              680 ml    Exam Awake Alert, Oriented x 3, No new F.N deficits, Normal affect [place].AT,PERRAL Supple Neck,No JVD, No cervical lymphadenopathy appriciated.  Symmetrical Chest wall movement, Good air movement bilaterally, CTAB RRR,No Gallops,Rubs or new Murmurs, No Parasternal Heave +ve B.Sounds, Abd Soft, Minimal epigastric tenderness on palpation, No organomegaly appriciated, No rebound -guarding or rigidity. No  Cyanosis, Clubbing or edema, No new Rash or bruise   Data Review   CBC w Diff: Lab Results  Component  Value Date   WBC 5.5 11/03/2016   HGB 8.9 (L) 11/03/2016   HCT 28.1 (L) 11/03/2016   PLT 414 (H) 11/03/2016   LYMPHOPCT 28 10/30/2016   MONOPCT 5 10/30/2016   EOSPCT 2 10/30/2016   BASOPCT 1 10/30/2016    CMP: Lab Results  Component Value Date   NA 137 11/03/2016   K 4.0 11/03/2016   CL 100 (L) 11/03/2016   CO2 26 11/03/2016   BUN 9 11/03/2016   CREATININE 0.90 11/03/2016   PROT 7.3 11/03/2016   ALBUMIN 2.9 (L) 11/03/2016   BILITOT 0.7 11/03/2016   ALKPHOS 45 11/03/2016   AST 26 11/03/2016   ALT 20 11/03/2016  .   Total Time in preparing paper work, data evaluation and todays exam - 35 minutes  Leroy SeaSINGH,PRASHANT K M.D on 11/04/2016 at 9:12 AM  [facility]   Office  [phone]

## 2016-11-04 NOTE — Discharge Instructions (Signed)
Follow with Primary MD Melissa Beasley, DANA, DO in 7 days   Get CBC, CMP, 2 view Chest X ray checked  by Primary MD or SNF MD in 5-7 days ( we routinely change or add medications that can affect your baseline labs and fluid status, therefore we recommend that you get the mentioned basic workup next visit with your PCP, your PCP may decide not to get them or add new tests based on their clinical decision)  Activity: As tolerated with Full fall precautions use walker/cane & assistance as needed.  Disposition Home    Diet:   DIET SOFT Heart Healthy Low  Carb for 1 week then advance gradually to Regular Heart Healthy Low Carb.  Accuchecks 4 times/day, Once in AM empty stomach and then before each meal. Log in all results and show them to your Prim.MD in 3 days. If any glucose reading is under 80 or above 300 call your Prim MD immidiately. Follow Low glucose instructions for glucose under 80 as instructed.   For Heart failure patients - Check your Weight same time everyday, if you gain over 2 pounds, or you develop in leg swelling, experience more shortness of breath or chest pain, call your Primary MD immediately. Follow Cardiac Low Salt Diet and 1.5 lit/day fluid restriction.  On your next visit with your primary care physician please Get Medicines reviewed and adjusted.  Please request your Prim.MD to go over all Hospital Tests and Procedure/Radiological results at the follow up, please get all Hospital records sent to your Prim MD by signing hospital release before you go home.  If you experience worsening of your admission symptoms, develop shortness of breath, life threatening emergency, suicidal or homicidal thoughts you must seek medical attention immediately by calling 911 or calling your MD immediately  if symptoms less severe.  You Must read complete instructions/literature along with all the possible adverse reactions/side effects for all the Medicines you take and that have been prescribed  to you. Take any new Medicines after you have completely understood and accpet all the possible adverse reactions/side effects.   Do not drive, operate heavy machinery, perform activities at heights, swimming or participation in water activities or provide baby sitting services if your were admitted for syncope or siezures until you have seen by Primary MD or a Neurologist and advised to do so again.  Do not drive when taking Pain medications.    Do not take more than prescribed Pain, Sleep and Anxiety Medications  Special Instructions: If you have smoked or chewed Tobacco  in the last 2 yrs please stop smoking, stop any regular Alcohol  and or any Recreational drug use.  Wear Seat belts while driving.   Please note  You were cared for by a hospitalist during your hospital stay. If you have any questions about your discharge medications or the care you received while you were in the hospital after you are discharged, you can call the unit and asked to speak with the hospitalist on call if the hospitalist that took care of you is not available. Once you are discharged, your primary care physician will handle any further medical issues. Please note that NO REFILLS for any discharge medications will be authorized once you are discharged, as it is imperative that you return to your primary care physician (or establish a relationship with a primary care physician if you do not have one) for your aftercare needs so that they can reassess your need for medications and monitor your  lab values.

## 2016-11-04 NOTE — Progress Notes (Signed)
Discharge instructions (including medications) discussed with and copy provided to patient/caregiver 

## 2018-03-16 IMAGING — CR DG CHEST 1V PORT
1 series · 1 of 1 positions shown · non-contrast
Comparison: 10/12/2016

CLINICAL DATA: Acute respiratory distress

EXAM:
PORTABLE CHEST 1 VIEW

[AP]
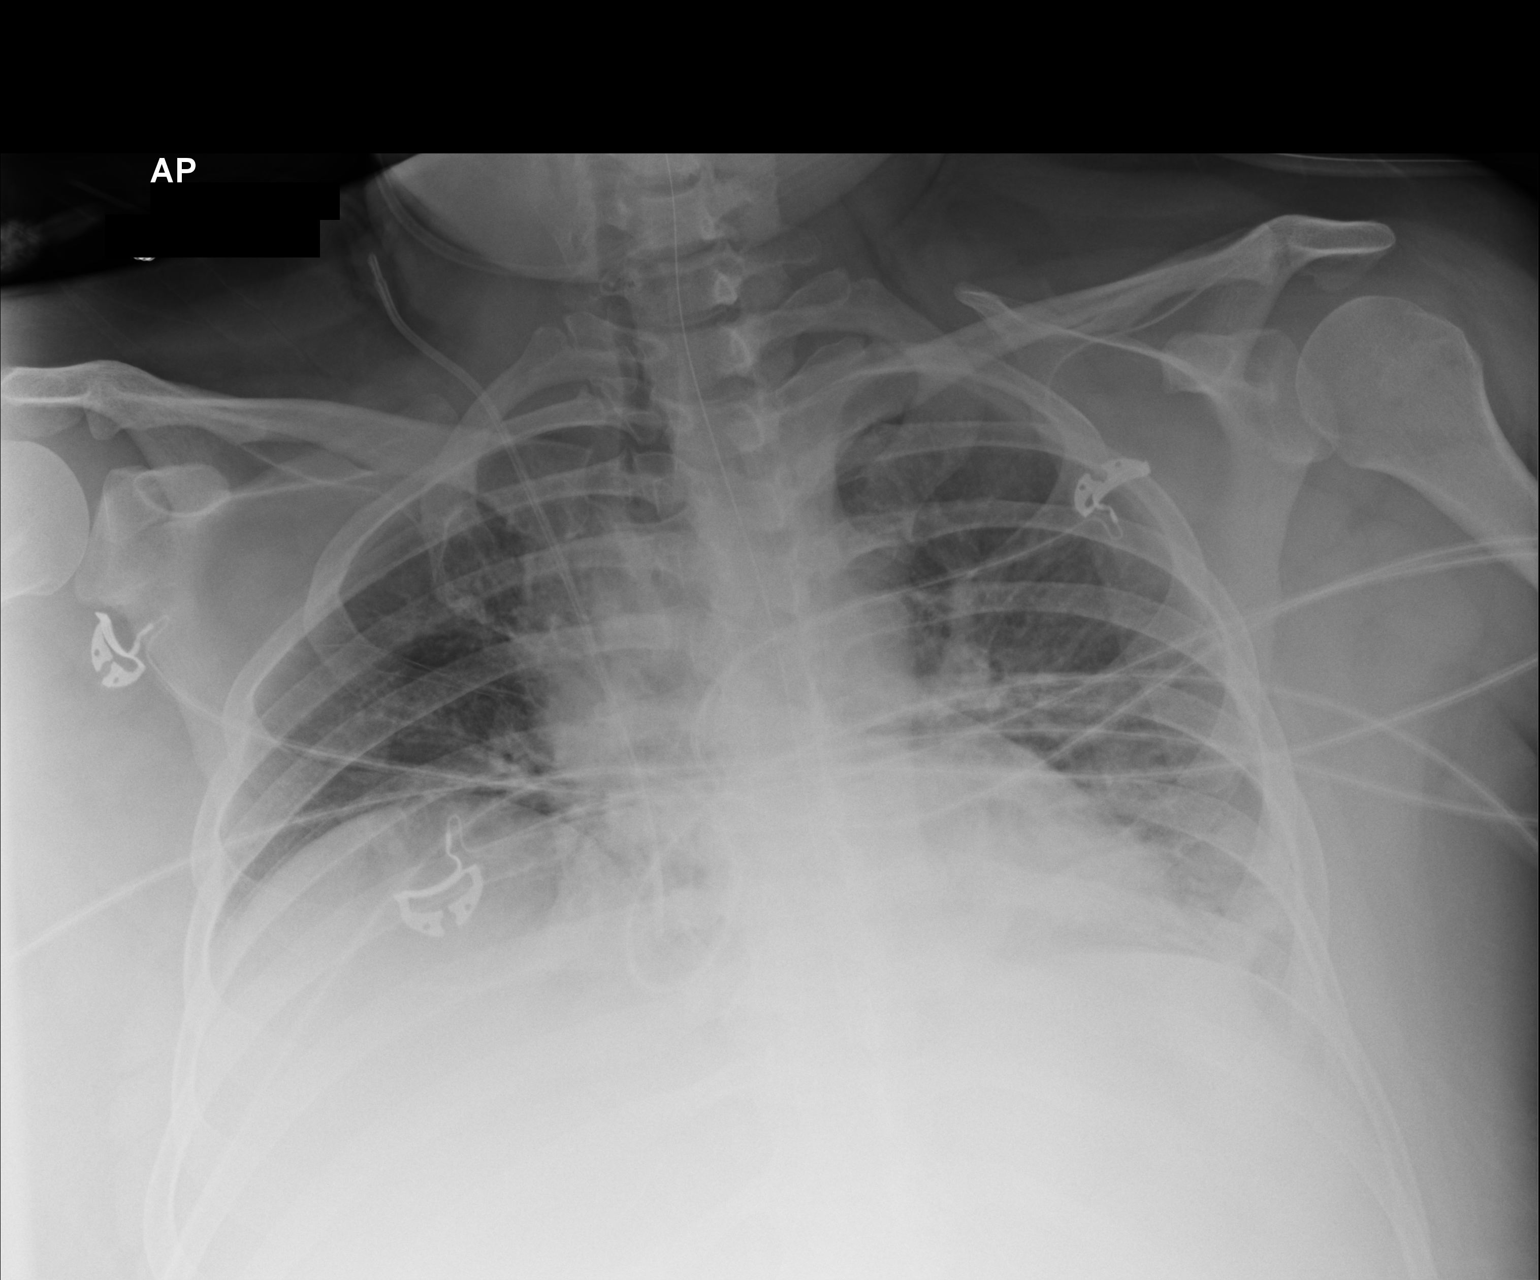

[1 of 1 positions shown; findings below may reference images not displayed]

FINDINGS: Right jugular central line and nasogastric catheter are noted in
satisfactory position. Cardiac shadow is stable. The overall
inspiratory effort is poor with persistent left basilar atelectasis.
No new focal abnormality is seen.
IMPRESSION: No change from the prior exam.

## 2018-03-17 IMAGING — CR DG CHEST 1V PORT
1 series · 1 of 1 positions shown · non-contrast
Comparison: 10/12/2016

CLINICAL DATA: Shortness of Breath

EXAM:
PORTABLE CHEST 1 VIEW

[AP]
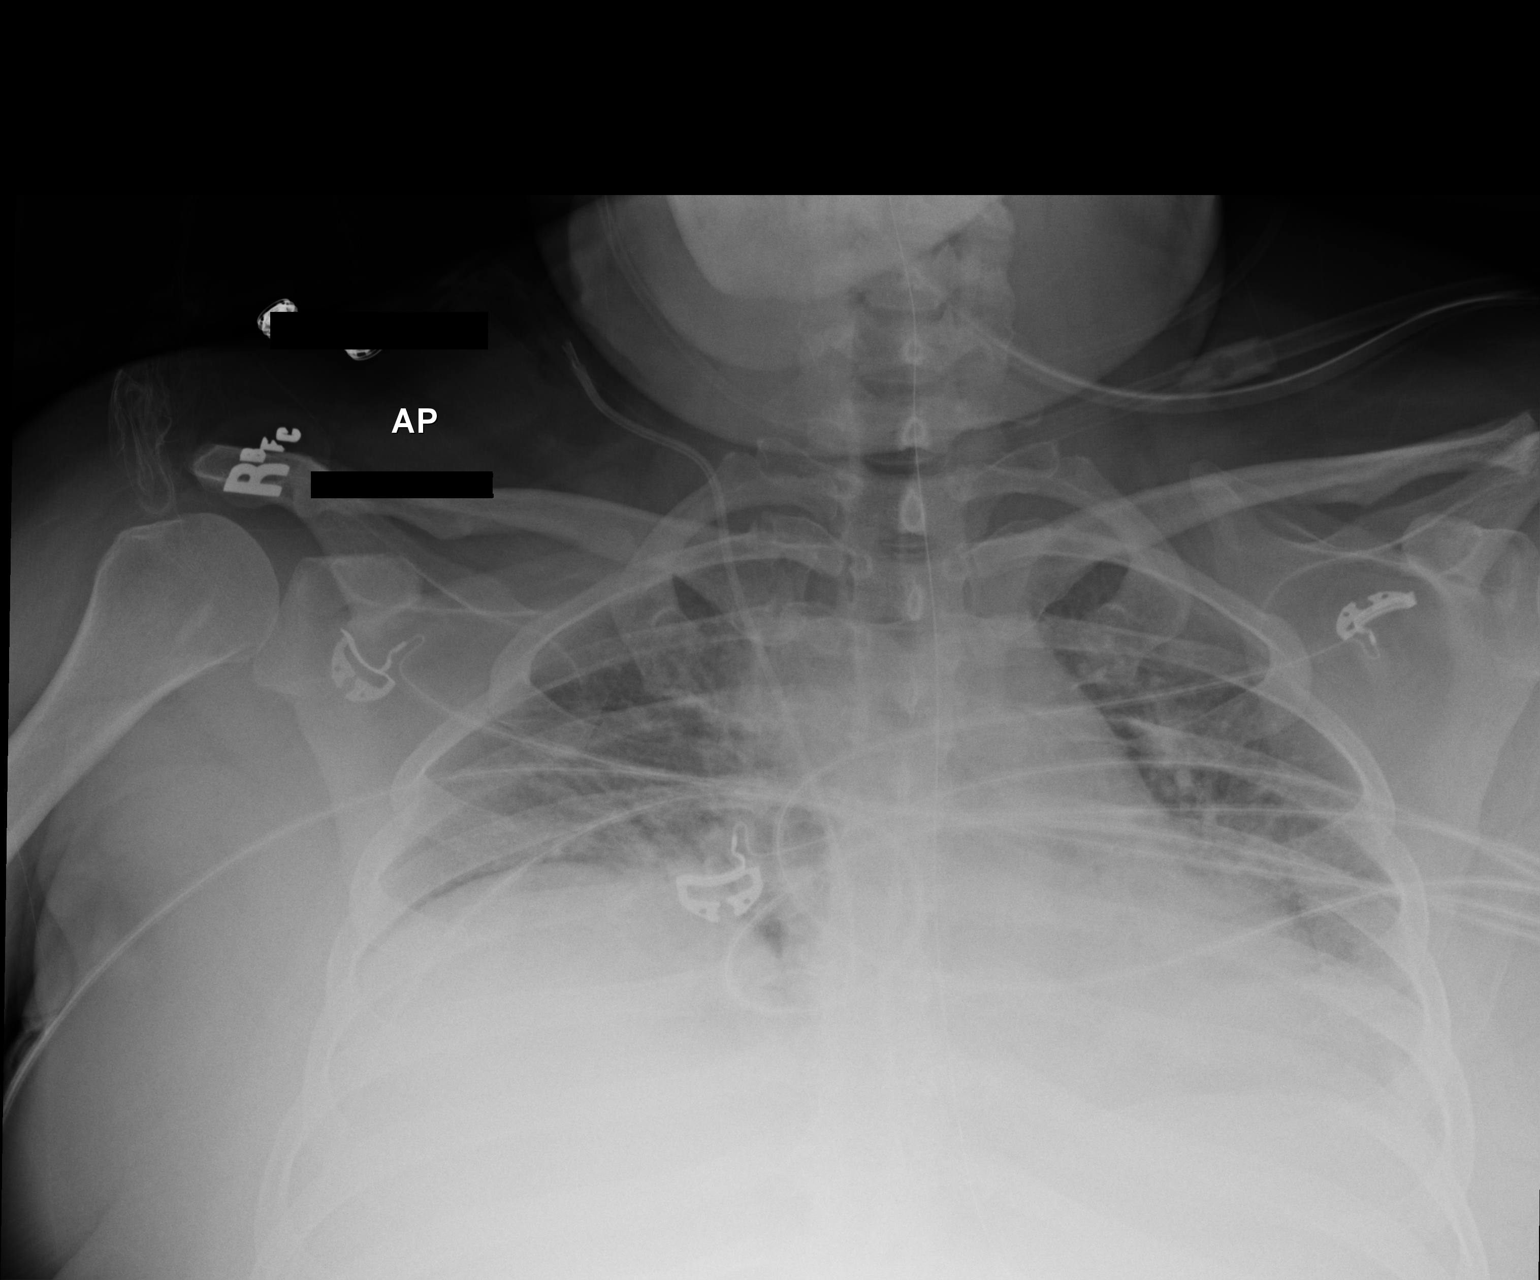

[1 of 1 positions shown; findings below may reference images not displayed]

FINDINGS: Very low lung volumes with bilateral airspace opacities. Heart is
borderline in size. No definite effusions. Right central line and NG
tube are unchanged.
IMPRESSION: Very low lung volumes with diffuse bilateral airspace disease,
worsened since prior study.

## 2018-03-22 IMAGING — CT CT CHEST W/ CM
3 of 5 series · 14 of 36 positions shown, 16 images · IV contrast (APPLIED)
Comparison: CT of the abdomen and pelvis performed 10/11/2016, and
chest radiograph performed 10/13/2016

CLINICAL DATA: Concern for sepsis in patient with pancreatitis.
Further evaluation requested. Initial encounter.

EXAM:
CT CHEST, ABDOMEN, AND PELVIS WITH CONTRAST
TECHNIQUE: Multidetector CT imaging of the chest, abdomen and pelvis was
performed following the standard protocol during bolus
administration of intravenous contrast.
CONTRAST:  100 mL [id number] IOPAMIDOL ([id number]) INJECTION 61%

[Series 2: cap 5.0 i31f 1 · axial · 0.95mm/px · z∈[-563,-143]mm · 5 of 128 slices shown, 7 images]
[im 22/128  mediastinal]
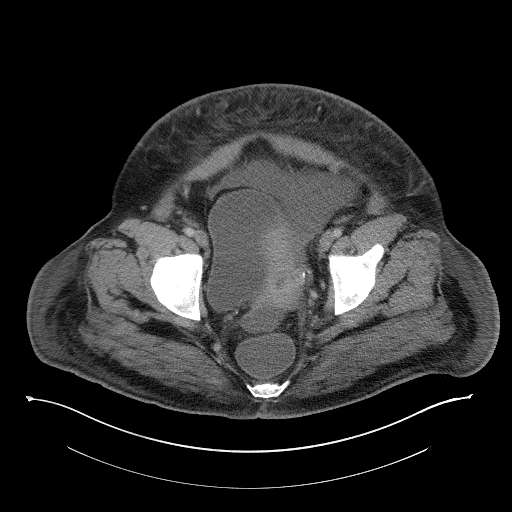
[im 22/128  lung]
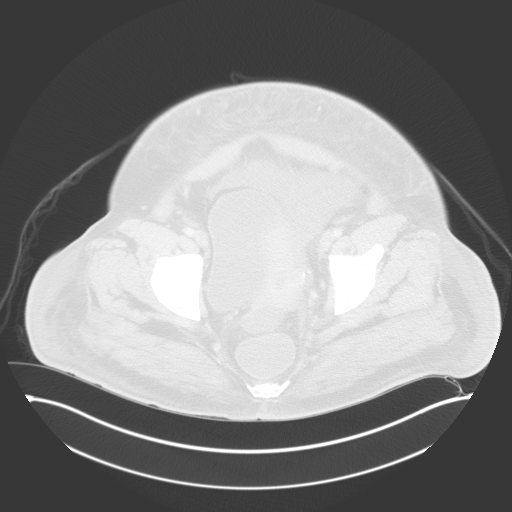
[im 43/128  lung]
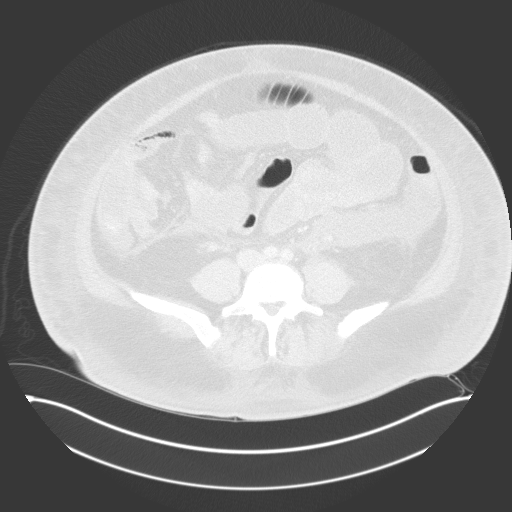
[im 64/128  lung]
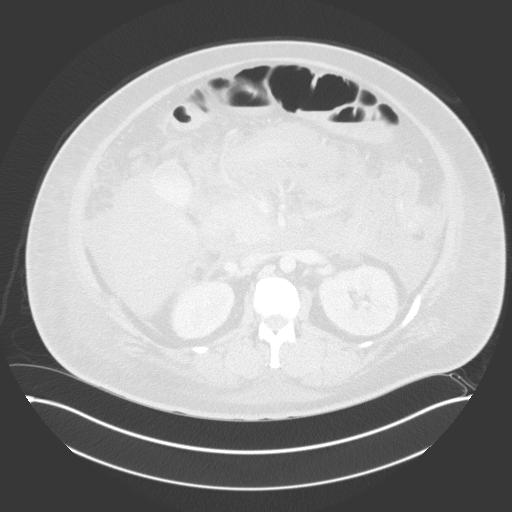
[im 85/128  lung]
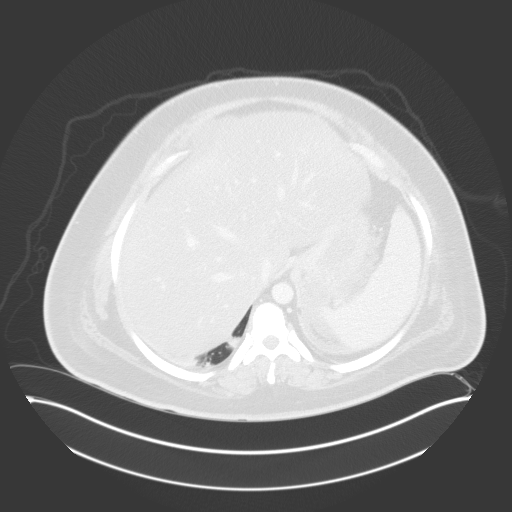
[im 106/128  mediastinal]
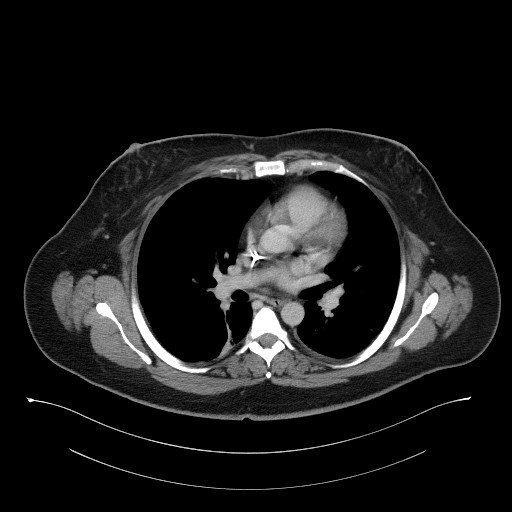
[im 106/128  lung]
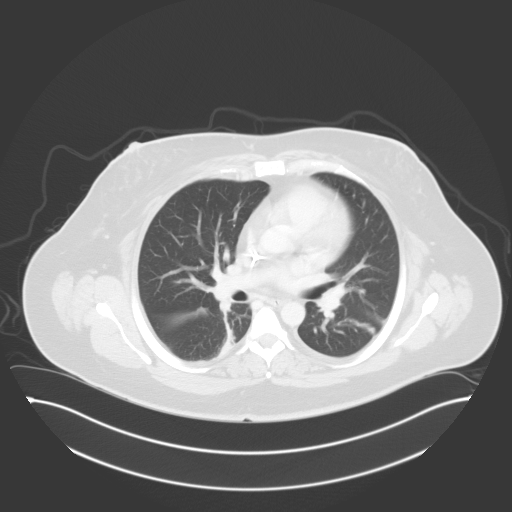

[Series 4: lungs · axial · 0.95mm/px · z∈[-415,-173]mm · 6 of 209 slices shown]
[im 18/209  lung]
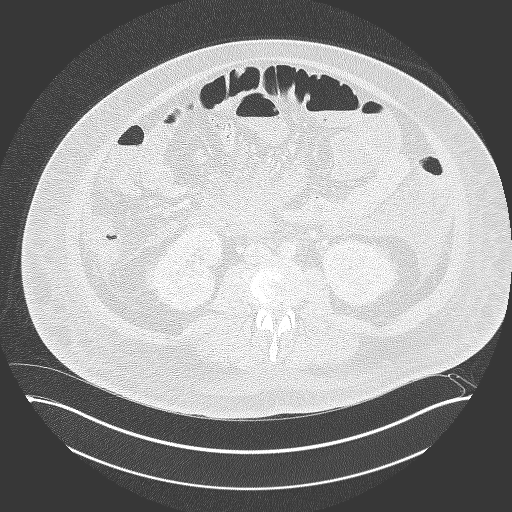
[im 53/209  lung]
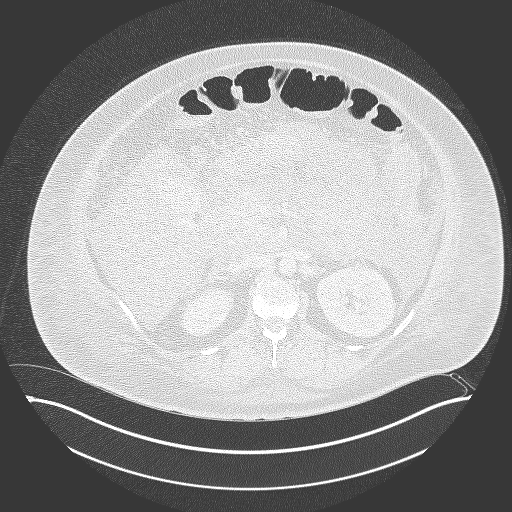
[im 70/209  lung]
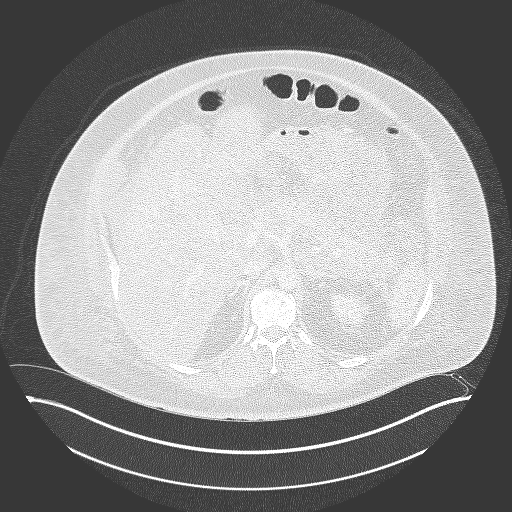
[im 87/209  lung]
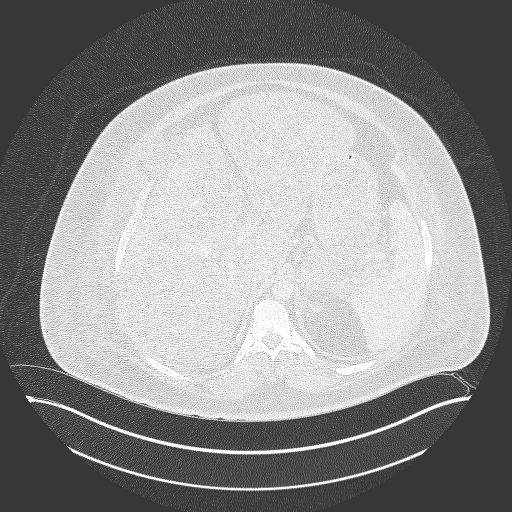
[im 122/209  lung]
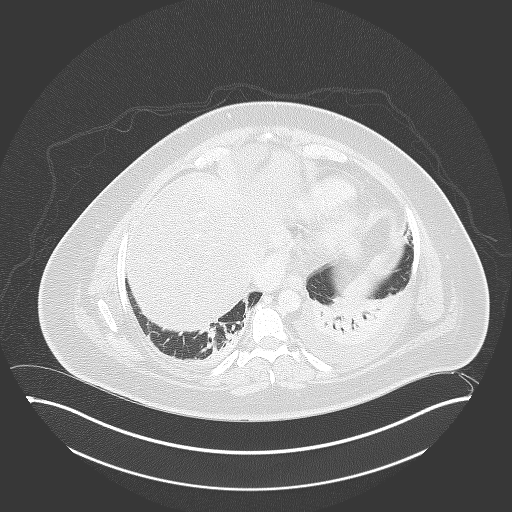
[im 139/209  lung]
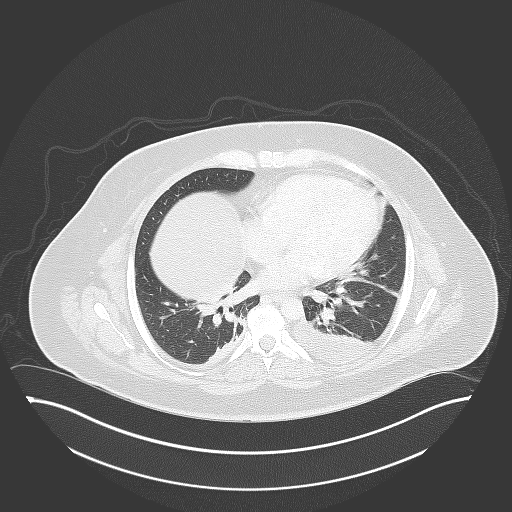

[Series 5: coronal · coronal · 0.90mm/px · 3 of 176 slices shown]
[im 36/176  lung]
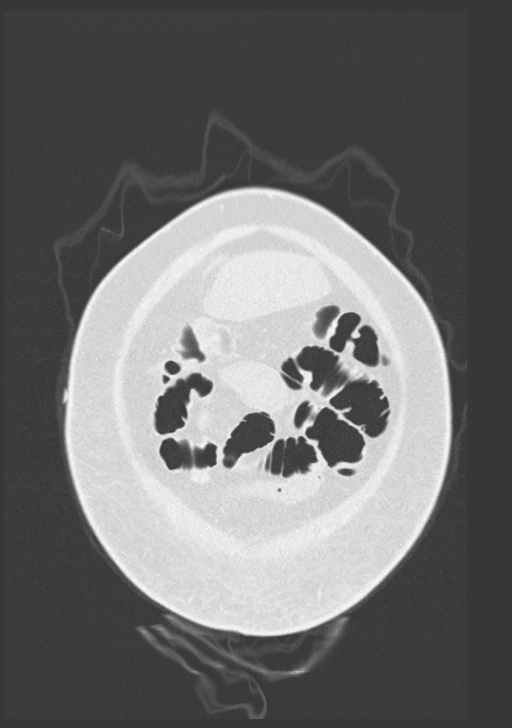
[im 71/176  lung]
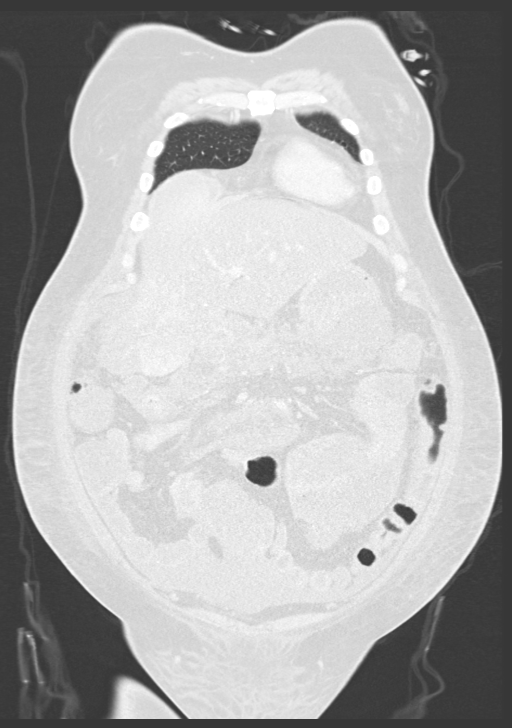
[im 106/176  lung]
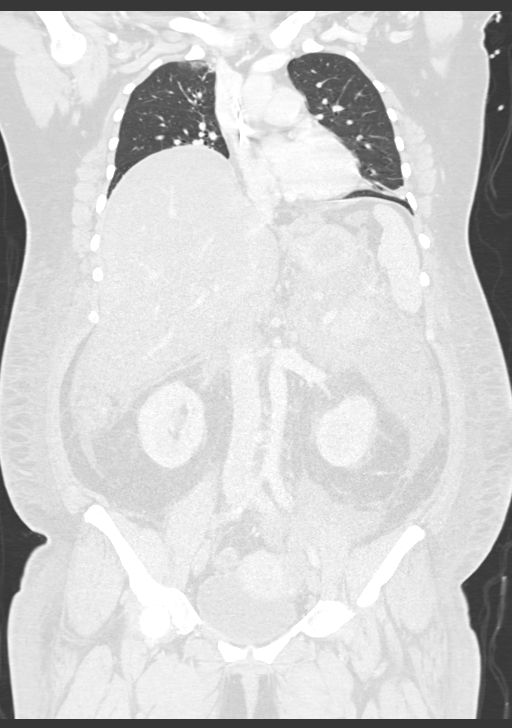

[14 of 36 positions shown; findings below may reference images not displayed]

FINDINGS: CT CHEST FINDINGS

Cardiovascular: The heart is normal in size. Scattered coronary
artery calcifications are seen. A right IJ line is noted ending
about the distal SVC. The thoracic aorta is grossly unremarkable.
The great vessels are grossly unremarkable.

Mediastinum/Nodes: The mediastinum is grossly unremarkable in
appearance. No mediastinal lymphadenopathy is seen. No pericardial
effusion is identified. The visualized portions of the thyroid gland
are unremarkable. No axillary lymphadenopathy is seen.

A small nodule at the right breast is grossly stable from 5008 and
likely reflects normal fibroglandular tissue.

Lungs/Pleura: Small left and trace right pleural effusions are
noted. Bibasilar airspace opacities may reflect atelectasis or
possibly pneumonia. No pneumothorax is seen. No dominant mass is
identified.

Musculoskeletal: No acute osseous abnormalities are identified. The
visualized musculature is unremarkable in appearance.

CT ABDOMEN PELVIS FINDINGS

Hepatobiliary: There is diffuse fatty infiltration within the liver.
The gallbladder is partially filled with contrast. The common bile
duct is not well characterized.

Pancreas: There is diffuse soft tissue inflammation about the
pancreas, with associated fluid tracking about the stomach, small
bowel loops, liver, spleen, and along both paracolic gutters into
the pelvis. This is compatible with relatively severe acute
pancreatitis. No well defined pseudocyst is seen. There is no
definite evidence of devascularization at this time.

Spleen: The spleen is grossly unremarkable in appearance.

Adrenals/Urinary Tract: The adrenal glands are unremarkable in
appearance. The kidneys are within normal limits. There is no
evidence of hydronephrosis. No renal or ureteral stones are
identified. Nonspecific perinephric stranding is noted bilaterally.

Stomach/Bowel: The stomach is unremarkable in appearance. The small
bowel is within normal limits. The appendix is normal in caliber,
without evidence of appendicitis. The colon is unremarkable in
appearance.

Vascular/Lymphatic: Minimal calcification is seen along the
abdominal aorta. No retroperitoneal or pelvic sidewall
lymphadenopathy is seen.

Reproductive: The bladder is mildly distended and grossly
unremarkable. The uterus is grossly unremarkable. The ovaries are
relatively symmetric. No suspicious adnexal masses are seen.

Other: Diffuse soft tissue edema is seen along the abdominal and
pelvic wall, likely reflecting anasarca. A small anterior abdominal
wall hernia is seen just superior to the umbilicus, containing only
fat.

Musculoskeletal: No acute osseous abnormalities are identified.
Vacuum phenomenon is noted at L5-S1. The visualized musculature is
unremarkable in appearance.
IMPRESSION: 1. Acute pancreatitis, with diffuse soft tissue inflammation about
the pancreas, and small to moderate volume ascites within the
abdomen and pelvis. No well defined pseudocyst seen. No definite
evidence of devascularization at this time.
2. Small left and trace right pleural effusions. Bibasilar airspace
opacities may reflect atelectasis or possibly pneumonia.
3. Diffuse soft tissue edema along the abdominal and pelvic wall,
likely reflecting anasarca.
4. Scattered coronary artery calcifications seen.
5. Diffuse fatty infiltration within the liver.
6. Small anterior abdominal wall hernia just superior to the
umbilicus, containing only fat.

## 2024-02-26 ENCOUNTER — Emergency Department (HOSPITAL_COMMUNITY)
Admission: EM | Admit: 2024-02-26 | Discharge: 2024-02-27 | Disposition: A | Discharge status: Other Acute Inpatient Hospital | Attending: Emergency Medicine | Admitting: Emergency Medicine

## 2024-02-26 ENCOUNTER — Emergency Department (HOSPITAL_COMMUNITY)

## 2024-02-26 DIAGNOSIS — Z87891 Personal history of nicotine dependence: Secondary | ICD-10-CM | POA: Diagnosis not present

## 2024-02-26 DIAGNOSIS — Z91040 Latex allergy status: Secondary | ICD-10-CM | POA: Diagnosis not present

## 2024-02-26 DIAGNOSIS — I2699 Other pulmonary embolism without acute cor pulmonale: Secondary | ICD-10-CM | POA: Diagnosis not present

## 2024-02-26 DIAGNOSIS — R4182 Altered mental status, unspecified: Secondary | ICD-10-CM | POA: Diagnosis present

## 2024-02-26 DIAGNOSIS — A419 Sepsis, unspecified organism: Secondary | ICD-10-CM | POA: Diagnosis not present

## 2024-02-26 DIAGNOSIS — E119 Type 2 diabetes mellitus without complications: Secondary | ICD-10-CM | POA: Diagnosis not present

## 2024-02-26 DIAGNOSIS — R6520 Severe sepsis without septic shock: Secondary | ICD-10-CM | POA: Insufficient documentation

## 2024-02-26 LAB — CBC WITH DIFFERENTIAL/PLATELET
Abs Immature Granulocytes: 0.06 10*3/uL (ref 0.00–0.07)
Basophils Absolute: 0 10*3/uL (ref 0.0–0.1)
Basophils Relative: 0 %
Eosinophils Absolute: 0 10*3/uL (ref 0.0–0.5)
Eosinophils Relative: 0 %
HCT: 53.6 % — ABNORMAL HIGH (ref 36.0–46.0)
Hemoglobin: 17.5 g/dL — ABNORMAL HIGH (ref 12.0–15.0)
Immature Granulocytes: 0 %
Lymphocytes Relative: 19 %
Lymphs Abs: 2.6 10*3/uL (ref 0.7–4.0)
MCH: 29 pg (ref 26.0–34.0)
MCHC: 32.6 g/dL (ref 30.0–36.0)
MCV: 88.7 fL (ref 80.0–100.0)
Monocytes Absolute: 0.7 10*3/uL (ref 0.1–1.0)
Monocytes Relative: 5 %
Neutro Abs: 10.5 10*3/uL — ABNORMAL HIGH (ref 1.7–7.7)
Neutrophils Relative %: 76 %
Platelets: 236 10*3/uL (ref 150–400)
RBC: 6.04 MIL/uL — ABNORMAL HIGH (ref 3.87–5.11)
RDW: 15.4 % (ref 11.5–15.5)
WBC: 13.9 10*3/uL — ABNORMAL HIGH (ref 4.0–10.5)
nRBC: 0 % (ref 0.0–0.2)

## 2024-02-26 LAB — HCG, SERUM, QUALITATIVE: Preg, Serum: NEGATIVE

## 2024-02-26 LAB — I-STAT CG4 LACTIC ACID, ED: Lactic Acid, Venous: 2.2 mmol/L (ref 0.5–1.9)

## 2024-02-26 LAB — COMPREHENSIVE METABOLIC PANEL WITH GFR
ALT: 33 U/L (ref 0–44)
AST: 19 U/L (ref 15–41)
Albumin: 4.1 g/dL (ref 3.5–5.0)
Alkaline Phosphatase: 36 U/L — ABNORMAL LOW (ref 38–126)
Anion gap: 19 — ABNORMAL HIGH (ref 5–15)
BUN: 66 mg/dL — ABNORMAL HIGH (ref 6–20)
CO2: 19 mmol/L — ABNORMAL LOW (ref 22–32)
Calcium: 10.1 mg/dL (ref 8.9–10.3)
Chloride: 99 mmol/L (ref 98–111)
Creatinine, Ser: 1.13 mg/dL — ABNORMAL HIGH (ref 0.44–1.00)
GFR, Estimated: >60 mL/min (ref >60)
Glucose, Bld: 234 mg/dL — ABNORMAL HIGH (ref 70–99)
Potassium: 4.6 mmol/L (ref 3.5–5.1)
Sodium: 137 mmol/L (ref 135–145)
Total Bilirubin: 2 mg/dL — ABNORMAL HIGH (ref 0.0–1.2)
Total Protein: 7.9 g/dL (ref 6.5–8.1)

## 2024-02-26 LAB — PROTIME-INR
INR: 0.9 (ref 0.8–1.2)
Prothrombin Time: 12.5 s (ref 11.4–15.2)

## 2024-02-26 MED ORDER — DEXAMETHASONE SODIUM PHOSPHATE 10 MG/ML IJ SOLN: 10.0000 mg | Freq: Once | INTRAMUSCULAR | Status: DC

## 2024-02-26 MED ORDER — LACTATED RINGERS IV SOLN: INTRAVENOUS | Status: DC

## 2024-02-26 MED ORDER — VANCOMYCIN HCL 10 G IV SOLR
2000.0000 mg | Freq: Once | INTRAVENOUS | Status: AC
  Administered 2024-02-27: 2000 mg via INTRAVENOUS
  Filled 2024-02-26: qty 40

## 2024-02-26 MED ORDER — LACTATED RINGERS IV BOLUS (SEPSIS)
2000.0000 mL | Freq: Once | INTRAVENOUS | Status: AC
  Administered 2024-02-26: 2000 mL via INTRAVENOUS

## 2024-02-26 MED ORDER — SODIUM CHLORIDE 0.9 % IV SOLN
2.0000 g | Freq: Once | INTRAVENOUS | Status: AC
  Administered 2024-02-26: 2 g via INTRAVENOUS
  Filled 2024-02-26: qty 12.5

## 2024-02-26 MED ORDER — CEFTRIAXONE SODIUM 2 G IJ SOLR: 2.0000 g | Freq: Once | INTRAMUSCULAR | Status: DC

## 2024-02-26 MED ORDER — LACTATED RINGERS IV BOLUS (SEPSIS)
1000.0000 mL | Freq: Once | INTRAVENOUS | Status: AC
  Administered 2024-02-26: 1000 mL via INTRAVENOUS

## 2024-02-26 MED ORDER — VANCOMYCIN HCL IN DEXTROSE 1-5 GM/200ML-% IV SOLN: 1000.0000 mg | Freq: Once | INTRAVENOUS | Status: DC

## 2024-02-26 NOTE — ED Triage Notes (Addendum)
 Patient arrived with EMS from home , family reports 2 days of altered mental status /in and out of consciousness , brain surgery at [facility] 2 weeks ago.

## 2024-02-26 NOTE — Sepsis Progress Note (Signed)
 Elink monitoring for the code sepsis protocol.

## 2024-02-26 NOTE — ED Notes (Signed)
 Blood cultures collected by phlebotomist , LR IV bolus infusing . IV sites unremarkable .

## 2024-02-26 NOTE — ED Notes (Signed)
 Patient transported to CT scan .

## 2024-02-27 ENCOUNTER — Emergency Department (HOSPITAL_COMMUNITY)

## 2024-02-27 DIAGNOSIS — A419 Sepsis, unspecified organism: Secondary | ICD-10-CM | POA: Diagnosis not present

## 2024-02-27 DIAGNOSIS — Z91040 Latex allergy status: Secondary | ICD-10-CM | POA: Diagnosis not present

## 2024-02-27 DIAGNOSIS — R6520 Severe sepsis without septic shock: Secondary | ICD-10-CM | POA: Diagnosis not present

## 2024-02-27 DIAGNOSIS — R4182 Altered mental status, unspecified: Secondary | ICD-10-CM | POA: Diagnosis present

## 2024-02-27 DIAGNOSIS — E119 Type 2 diabetes mellitus without complications: Secondary | ICD-10-CM | POA: Diagnosis not present

## 2024-02-27 DIAGNOSIS — I2699 Other pulmonary embolism without acute cor pulmonale: Secondary | ICD-10-CM | POA: Diagnosis not present

## 2024-02-27 DIAGNOSIS — Z87891 Personal history of nicotine dependence: Secondary | ICD-10-CM | POA: Diagnosis not present

## 2024-02-27 LAB — URINALYSIS, W/ REFLEX TO CULTURE (INFECTION SUSPECTED)
Bilirubin Urine: NEGATIVE
Glucose, UA: >=500 mg/dL — AB
Ketones, ur: 20 mg/dL — AB
Nitrite: NEGATIVE
Protein, ur: NEGATIVE mg/dL
Specific Gravity, Urine: 1.032 — ABNORMAL HIGH (ref 1.005–1.030)
pH: 5 (ref 5.0–8.0)

## 2024-02-27 LAB — I-STAT CG4 LACTIC ACID, ED: Lactic Acid, Venous: 1.3 mmol/L (ref 0.5–1.9)

## 2024-02-27 MED ORDER — SODIUM CHLORIDE 0.9 % IV SOLN: 2.0000 g | Freq: Three times a day (TID) | INTRAVENOUS | Status: DC

## 2024-02-27 MED ORDER — LEVETIRACETAM (KEPPRA) 500 MG/5 ML ADULT IV PUSH
1000.0000 mg | Freq: Once | INTRAVENOUS | Status: AC
  Administered 2024-02-27: 1000 mg via INTRAVENOUS
  Filled 2024-02-27: qty 10

## 2024-02-27 MED ORDER — HEPARIN (PORCINE) 25000 UT/250ML-% IV SOLN
1000.0000 [IU]/h | INTRAVENOUS | Status: DC
  Administered 2024-02-27: 1000 [IU]/h via INTRAVENOUS
  Filled 2024-02-27: qty 250

## 2024-02-27 MED ORDER — DEXAMETHASONE SODIUM PHOSPHATE 10 MG/ML IJ SOLN
10.0000 mg | Freq: Once | INTRAMUSCULAR | Status: AC
  Administered 2024-02-27: 10 mg via INTRAMUSCULAR
  Filled 2024-02-27: qty 1

## 2024-02-27 MED ORDER — VANCOMYCIN HCL IN DEXTROSE 1-5 GM/200ML-% IV SOLN: 1000.0000 mg | Freq: Two times a day (BID) | INTRAVENOUS | Status: DC

## 2024-02-27 MED ORDER — IOHEXOL 350 MG/ML SOLN
75.0000 mL | Freq: Once | INTRAVENOUS | Status: AC | PRN
  Administered 2024-02-27: 75 mL via INTRAVENOUS

## 2024-02-27 NOTE — ED Notes (Signed)
 Report given to Jenell Mirza RN at [facility] ER , [facility] notified by Diplomatic Services operational officer for transport.

## 2024-02-27 NOTE — Progress Notes (Signed)
 PHARMACY - ANTICOAGULATION CONSULT NOTE  Pharmacy Consult for Heparin   Indication: pulmonary embolus (s/p right frontal craniotomy 2 weeks ago)  Allergies  Allergen Reactions   Latex Hives   Augmentin  [Amoxicillin -Pot Clavulanate] Other (See Comments)    Unknown reaction   Acetaminophen  Palpitations   Metformin Diarrhea    Vital Signs: Temp: 97.7 F (36.5 C) (06/13 0435) Temp Source: Oral (06/13 0435) BP: 127/93 (06/13 0445) Pulse Rate: 84 (06/13 0445)  Labs: Recent Labs    02/26/24 2258  HGB 17.5*  HCT 53.6*  PLT 236  LABPROT 12.5  INR 0.9  CREATININE 1.13*    CrCl cannot be calculated (Unknown ideal weight.).   Medical History: Past Medical History:  Diagnosis Date   Diabetes mellitus without complication (HCC)    TYPE 2   Hypertriglyceridemia    Pancreatitis    Assessment: 43 y/o F s/p right frontal craniotomy 2 weeks ago presents to the ED with 2 day of altered mental status and falls (worse than baseline). Also noted to have low oxygen saturations. Found to have new onset pulmonary embolus. Above labs reviewed. PTA meds reviewed. Starting heparin  (ED PA discussed this with neurosurgery team at [facility]).   Goal of Therapy:  Heparin  level 0.3-0.5 units/ml Monitor platelets by anticoagulation protocol: Yes   Plan:  Will defer bolus with recent craniotomy  Start heparin  drip at 1000 units/hr Heparin  level in 6-8 hours  Pending transfer to [facility], PharmD, BCPS Clinical Pharmacist Phone: [phone]

## 2024-02-27 NOTE — ED Notes (Signed)
 Report given to [facility] at arrival , transported to [facility] ER .

## 2024-02-27 NOTE — Progress Notes (Signed)
 Pharmacy Antibiotic Note  Melissa Beasley is a 43 y.o. female admitted on 02/26/2024 with rule out meningitis s/p neurosurgery 2 weeks ago.  Pharmacy has been consulted for Vancomycin  dosing.  Plan: Vancomycin  1000 mg IV q12h >>>No estimated AUC-traditional dosing with CNS involvement  Cefepime  2g IV q8h per provider  Trend WBC, temp, renal function  F/U infectious work-up Drug levels as indicated Pending transfer to [facility]   Temp (24hrs), Avg:98 F (36.7 C), Min:97.4 F (36.3 C), Max:99.3 F (37.4 C)  Recent Labs  Lab 02/26/24 2258 02/26/24 2324 02/27/24 0045  WBC 13.9*  --   --   CREATININE 1.13*  --   --   LATICACIDVEN  --  2.2* 1.3    CrCl cannot be calculated (Unknown ideal weight.).    Allergies  Allergen Reactions   Latex Hives   Augmentin  [Amoxicillin -Pot Clavulanate] Other (See Comments)    Unknown reaction   Acetaminophen  Palpitations   Metformin Diarrhea   Silvestre Drum, PharmD, BCPS Clinical Pharmacist Phone: [phone]

## 2024-02-27 NOTE — ED Notes (Signed)
 Patient waiting for [facility] for transport.

## 2024-02-27 NOTE — ED Provider Notes (Signed)
 [place] [facility]MENT AT [facility] Provider Note   CSN: [id number] Arrival date & time: 02/26/24  2239     Patient presents with: Altered Mental Status  (Brain Surgery last Friday )   Melissa Beasley is a 43 y.o. female.  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  Patient's family states that she had a follow-up appointment on June 9 and that everything was going well.  Family states that for the past 2 days the patient has not been ambulatory, has been staying in bed, will not eat or drink, and has been nonverbal.  She is normally ambulatory with assistance and is normally verbal.  These changes have gradually come on over the past 2 days.  The patient is currently incontinent but family states that she can frequently use the restroom with assistance.  They report a low-grade fever at home in the 99 degree range and also report home SpO2 readings in the 80% range and a heart rate in the 120 range. Level 5 caveat due to patient's current health status  HPI     Prior to Admission medications   Medication Sig Start Date End Date Taking? Authorizing Provider  atorvastatin  (LIPITOR) 40 MG tablet Take 1 tablet (40 mg total) by mouth daily at 6 PM. 11/04/16  Yes Singh, Prashant K, MD  ciprofloxacin (CIPRO) 500 MG tablet Take 500 mg by mouth 2 (two) times daily. 02/23/24  Yes [provider]  dexamethasone  (DECADRON ) 4 MG tablet SMARTSIG:1 Tablet(s) By Mouth Morning-Evening 02/23/24  Yes [provider]  docusate sodium  (COLACE) 100 MG capsule Take 2 capsules (200 mg total) by mouth 2 (two) times daily. 11/04/16  Yes Singh, Prashant K, MD  EQ STOOL SOFTENER/LAXATIVE 8.6-50 MG tablet Take 1 tablet by mouth at bedtime. 02/15/24  Yes [provider]  gemfibrozil  (LOPID ) 600 MG tablet Take 1  tablet (600 mg total) by mouth 2 (two) times daily before a meal. 10/24/16  Yes Gherghe, Bethene Brought, MD  glipiZIDE (GLUCOTROL XL) 10 MG 24 hr tablet Take 10 mg by mouth 2 (two) times daily. 01/01/24  Yes [provider]  JARDIANCE 25 MG TABS tablet Take 25 mg by mouth daily. 01/23/24  Yes [provider]  lisinopril (ZESTRIL) 5 MG tablet Take 5 mg by mouth daily. 12/10/23  Yes [provider]  methocarbamol (ROBAXIN) 500 MG tablet Take 500 mg by mouth 3 (three) times daily as needed. 02/15/24  Yes [provider]  naloxone [facility]) nasal spray 4 mg/0.1 mL SMARTSIG:1 Both Nares Daily 02/15/24  Yes [provider]  oxyCODONE  (OXY IR/ROXICODONE ) 5 MG immediate release tablet Take 5 mg by mouth every 4 (four) hours as needed. 02/15/24  Yes [provider]  Oxycodone  HCl 10 MG TABS Take 10 mg by mouth every 4 (four) hours as needed. 02/15/24  Yes [provider]  pantoprazole  (PROTONIX ) 20 MG tablet Take 20 mg by mouth daily. 10/28/16  Yes [provider]  polyethylene glycol powder (GLYCOLAX /MIRALAX ) 17 GM/SCOOP powder Take 17 g by mouth daily as needed. 02/15/24  Yes [provider]  solifenacin (VESICARE) 5 MG tablet Take 5 mg by mouth daily. 12/23/23  Yes [provider]  topiramate (TOPAMAX) 50 MG tablet Take 25 mg by mouth daily. 01/22/24  Yes [provider]  potassium  chloride (K-DUR,KLOR-CON ) 10 MEQ tablet Take 10 mEq by mouth daily.     [provider]    Allergies: Latex, Augmentin  [amoxicillin -pot clavulanate], Acetaminophen , and Metformin    Review of Systems  Updated Vital Signs BP (!) 130/100   Pulse 85   Temp (!) 97.4 F (36.3 C) (Temporal)   Resp 13   SpO2 99%   Physical Exam Vitals and nursing note reviewed.  Constitutional:      General: She is not in acute distress.    Appearance: She is well-developed.  HENT:     Head: Normocephalic and atraumatic.   Eyes:     Conjunctiva/sclera:  Conjunctivae normal.     Pupils: Pupils are equal, round, and reactive to light.    Cardiovascular:     Rate and Rhythm: Normal rate and regular rhythm.  Pulmonary:     Effort: Pulmonary effort is normal. No respiratory distress.     Breath sounds: Normal breath sounds.  Abdominal:     Palpations: Abdomen is soft.     Tenderness: There is no abdominal tenderness.   Musculoskeletal:        General: No swelling.     Cervical back: Neck supple.   Skin:    General: Skin is warm and dry.     Capillary Refill: Capillary refill takes less than 2 seconds.   Neurological:     Mental Status: She is alert.     Comments: Patient intermittently following commands, no focal deficit appreciated, patient nonverbal and unable to answer questions  Psychiatric:        Mood and Affect: Mood normal.    (all labs ordered are listed, but only abnormal results are displayed) Labs Reviewed  COMPREHENSIVE METABOLIC PANEL WITH GFR - Abnormal; Notable for the following components:      Result Value   CO2 19 (*)    Glucose, Bld 234 (*)    BUN 66 (*)    Creatinine, Ser 1.13 (*)    Alkaline Phosphatase 36 (*)    Total Bilirubin 2.0 (*)    Anion gap 19 (*)    All other components within normal limits  CBC WITH DIFFERENTIAL/PLATELET - Abnormal; Notable for the following components:   WBC 13.9 (*)    RBC 6.04 (*)    Hemoglobin 17.5 (*)    HCT 53.6 (*)    Neutro Abs 10.5 (*)    All other components within normal limits  URINALYSIS, W/ REFLEX TO CULTURE (INFECTION SUSPECTED) - Abnormal; Notable for the following components:   APPearance HAZY (*)    Specific Gravity, Urine 1.032 (*)    Glucose, UA >=500 (*)    Hgb urine dipstick SMALL (*)    Ketones, ur 20 (*)    Leukocytes,Ua TRACE (*)    Bacteria, UA RARE (*)    All other components within normal limits  I-STAT CG4 LACTIC ACID, ED - Abnormal; Notable for the following components:   Lactic Acid, Venous 2.2 (*)    All other components within  normal limits  CULTURE, BLOOD (ROUTINE X 2)  CULTURE, BLOOD (ROUTINE X 2)  PROTIME-INR  HCG, SERUM, QUALITATIVE  I-STAT CG4 LACTIC ACID, ED    EKG: EKG Interpretation Date/Time:  Thursday February 26 2024 22:41:54 EDT Ventricular Rate:  122 PR Interval:  138 QRS Duration:  75 QT Interval:  318 QTC Calculation: 453 R Axis:   1  Text Interpretation: Sinus tachycardia Anterior infarct, old Confirmed by Lowery Rue [id number]) on 02/26/2024 10:48:28 PM  Radiology: CT Angio Chest PE W and/or Wo Contrast Result Date: 02/27/2024 CLINICAL DATA:  Shortness of breath EXAM: CT ANGIOGRAPHY CHEST WITH CONTRAST TECHNIQUE: Multidetector CT imaging of the chest was performed using the standard protocol during bolus administration of intravenous contrast. Multiplanar CT image reconstructions and MIPs were obtained to evaluate the vascular anatomy. RADIATION DOSE REDUCTION: This exam was performed according to the departmental dose-optimization program which includes automated exposure control, adjustment of the mA and/or kV according to patient size and/or use of iterative reconstruction technique. CONTRAST:  75mL OMNIPAQUE IOHEXOL 350 MG/ML SOLN COMPARISON:  Chest x-ray previous day. FINDINGS: Cardiovascular: Thoracic aorta shows atherosclerotic calcifications. No aneurysmal dilatation or dissection is noted. No cardiac enlargement is seen. The pulmonary artery shows a normal branching pattern bilaterally. Filling defects are noted in the right lower lobe pulmonary artery at the segmental and subsegmental level. No right heart strain is noted. Coronary calcifications are seen. Mediastinum/Nodes: Thoracic inlet is within normal limits. No hilar or mediastinal adenopathy is noted. The esophagus as visualized is within normal limits. Lungs/Pleura: Lungs demonstrate bibasilar atelectatic change slightly worse on the left than the right. No focal infiltrate or sizable effusion is seen. No parenchymal nodules are noted.  Upper Abdomen: Visualized upper abdomen shows evidence of cholelithiasis without complicating factors. Liver is fatty infiltrated. Musculoskeletal: No acute bony abnormality is noted. Review of the MIP images confirms the above findings. IMPRESSION: Right lower lobe pulmonary emboli without evidence of right heart strain. Bibasilar atelectasis worse on the left than the right. Cholelithiasis. Aortic Atherosclerosis (ICD10-I70.0). Critical Value/emergent results were called by telephone at the time of interpretation on 02/27/2024 at 2:43 am to [facility] PA , who verbally acknowledged these results. Electronically Signed   By: Violeta Grey M.D.   On: 02/27/2024 02:51   CT Head Wo Contrast Result Date: 02/27/2024 CLINICAL DATA:  Recent surgery for brain tumor. Altered mental status. In and out of consciousness. EXAM: CT HEAD WITHOUT CONTRAST TECHNIQUE: Contiguous axial images were obtained from the base of the skull through the vertex without intravenous contrast. RADIATION DOSE REDUCTION: This exam was performed according to the departmental dose-optimization program which includes automated exposure control, adjustment of the mA and/or kV according to patient size and/or use of iterative reconstruction technique. COMPARISON:  MRI head 09/12/2023 and CT 07/24/2023 FINDINGS: Brain: Interval subtotal resection right frontal lobe brain mass. Vasogenic edema in the right greater than left frontal lobes. 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 Additional partial effacement of the anterior horn of the left lateral ventricle. Coarse calcifications along the left lateral ventricle. The appearance is worsened compared to MRI 09/12/2023 but appear similar to CT 07/24/2023. No evidence of acute infarct. No intracranial hemorrhage. The basal cisterns are patent. Vascular: No hyperdense vessel or unexpected  calcification. Skull: Bifrontal craniotomies. Small 3 mm subdural fluid and gas collection deep to the right frontal craniotomy is presumed postoperative. Sinuses/Orbits: Paranasal sinuses and mastoid air cells are well aerated. Globes are intact. Other: None. IMPRESSION: Interval subtotal resection of right frontal lobe brain mass. Small 3 mm subdural fluid and gas collection deep to the right frontal craniotomy is presumed postoperative. Vasogenic edema in the right greater than left frontal lobes. 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 The appearance is worsened compared to MRI 09/12/2023  but appear similar to CT 07/24/2023. Electronically Signed   By: Rozell Cornet M.D.   On: 02/27/2024 00:05   DG Chest Port 1 View Result Date: 02/26/2024 CLINICAL DATA:  Possible sepsis EXAM: PORTABLE CHEST 1 VIEW COMPARISON:  07/24/2023 FINDINGS: Low lung volumes with central bronchovascular crowding and augmented cardiac size. Aortic atherosclerosis. No consolidation or pleural effusion IMPRESSION: Hypoventilatory changes Electronically Signed   By: Esmeralda Hedge M.D.   On: 02/26/2024 23:02     .Critical Care  Performed by: Elisa Guest, PA-C Authorized by: Elisa Guest, PA-C   Critical care provider statement:    Critical care time (minutes):  75   Critical care time was exclusive of:  Separately billable procedures and treating other patients   Critical care was necessary to treat or prevent imminent or life-threatening deterioration of the following conditions:  Sepsis and cardiac failure (Pulmonary embolism, sepsis)   Critical care was time spent personally by me on the following activities:  Development of treatment plan with patient or surrogate, discussions with consultants, evaluation of patient's response to treatment, examination of patient, ordering and review of laboratory  studies, ordering and review of radiographic studies, ordering and performing treatments and interventions, pulse oximetry, re-evaluation of patient's condition and review of old charts   Care discussed with: admitting provider      Medications Ordered in the ED  lactated ringers  infusion ( Intravenous New Bag/Given 02/27/24 0134)  ceFEPIme  (MAXIPIME ) 2 g in sodium chloride  0.9 % 100 mL IVPB (has no administration in time range)  lactated ringers  bolus 1,000 mL (0 mLs Intravenous Stopped 02/27/24 0035)  lactated ringers  bolus 2,000 mL (0 mLs Intravenous Stopped 02/27/24 0127)  vancomycin  (VANCOCIN ) 2,000 mg in sodium chloride  0.9 % 500 mL IVPB (0 mg Intravenous Stopped 02/27/24 0236)  ceFEPIme  (MAXIPIME ) 2 g in sodium chloride  0.9 % 100 mL IVPB (0 g Intravenous Stopped 02/27/24 0007)  dexamethasone  (DECADRON ) injection 10 mg (10 mg Intramuscular Given 02/27/24 0120)  levETIRAcetam (KEPPRA) undiluted injection 1,000 mg (1,000 mg Intravenous Given 02/27/24 0123)  iohexol (OMNIPAQUE) 350 MG/ML injection 75 mL (75 mLs Intravenous Contrast Given 02/27/24 0226)                                    Medical Decision Making Amount and/or Complexity of Data Reviewed Labs: ordered. Radiology: ordered.  Risk Prescription drug management.   This patient presents to the ED for concern of altered mental status, this involves an extensive number of treatment options, and is a complaint that carries with it a high risk of complications and morbidity.  The differential diagnosis includes sepsis, intracranial abnormality, meningitis, Metabolic abnormality, others   Co morbidities / Chronic conditions that complicate the patient evaluation  Recent craniotomy, type II DM   Additional history obtained:  Additional history obtained from EMR and family External records from outside source obtained and reviewed including recent neurosurgery and oncology notes   Lab Tests:  I Ordered, and personally  interpreted labs.  The pertinent results include: Lactic acid 2.2, white count of 13,900, elevated BUN at 66   Imaging Studies ordered:  I ordered imaging studies including CT head, chest x-ray, CT angio chest PE study I independently visualized and interpreted imaging which showed  Interval subtotal resection of right frontal lobe brain mass. Small  3 mm subdural fluid and gas collection deep to the right frontal  craniotomy is presumed postoperative.  Vasogenic edema in the right greater than left frontal lobes.  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 The appearance is worsened compared to MRI  09/12/2023 but appear similar to CT 07/24/2023.  Hypoventilatory changes on chest x-ray Right lower lobe pulmonary emboli without evidence of right heart  strain.    Bibasilar atelectasis worse on the left than the right.    Cholelithiasis.    Aortic Atherosclerosis   I agree with the radiologist interpretation   Cardiac Monitoring: / EKG:  The patient was maintained on a cardiac monitor.  I personally viewed and interpreted the cardiac monitored which showed an underlying rhythm of: Sinus tachycardia   Problem List / ED Course / Critical interventions / Medication management   I ordered medication including vancomycin , cefepime , LR bolus, decadron , keppra, heparin  Reevaluation of the patient after these medicines showed that the patient improved I have reviewed the patients home medicines and have made adjustments as needed   Consultations Obtained:  I requested consultation with the neurosurgery team at [facility], Dr.Stevens,  and discussed lab and imaging findings as well as pertinent plan - they recommend: Keppra, decadron , PE study.  I requested further consultation with them for recommendations on anticoagulation with patient's recent craniotomy. He  recommends initiating heparin    Social Determinants of Health:  Patient is a former smoker, has Medicaid for her primary health insurance type   Test / Admission - Considered:  Patient with findings concerning for both sepsis and also concerning for pulmonary embolism.  Patient needs admission for management and would benefit from transfer to the tertiary care center where she had her brain surgery.  Plan for admission once bed is available. Updated by transfer center that bed is available. Planning on [facility] transfer      Final diagnoses:  Altered mental status, unspecified altered mental status type  Acute pulmonary embolism without acute cor pulmonale, unspecified pulmonary embolism type (HCC)  Sepsis with acute organ dysfunction without septic shock, due to unspecified organism, unspecified organ dysfunction type [facility])    ED Discharge Orders     None          Delories Fetter 02/27/24 1610    Rory Collard, MD 02/27/24 [phone]

## 2024-03-02 LAB — CULTURE, BLOOD (ROUTINE X 2)
Culture: NO GROWTH
Culture: NO GROWTH
Special Requests: ADEQUATE
